# Patient Record
Sex: Female | Born: 1935 | Race: White | Hispanic: No | Smoking: Never smoker
Health system: Southern US, Community
[De-identification: ages and names within clinical notes are randomized; demographics above are authoritative.]

## PROBLEM LIST (undated history)

## (undated) DIAGNOSIS — E039 Hypothyroidism, unspecified: Secondary | ICD-10-CM

## (undated) DIAGNOSIS — I10 Essential (primary) hypertension: Secondary | ICD-10-CM

## (undated) DIAGNOSIS — I82409 Acute embolism and thrombosis of unspecified deep veins of unspecified lower extremity: Secondary | ICD-10-CM

## (undated) DIAGNOSIS — E079 Disorder of thyroid, unspecified: Secondary | ICD-10-CM

## (undated) HISTORY — PX: BRAIN SURGERY: SHX531

---

## 1997-10-23 ENCOUNTER — Ambulatory Visit (HOSPITAL_COMMUNITY): Admission: RE | Admit: 1997-10-23 | Discharge: 1997-10-23 | Payer: Self-pay | Admitting: Obstetrics & Gynecology

## 2003-06-28 ENCOUNTER — Inpatient Hospital Stay (HOSPITAL_COMMUNITY): Admission: EM | Admit: 2003-06-28 | Discharge: 2003-06-30 | Payer: Self-pay | Admitting: Orthopedic Surgery

## 2004-08-15 ENCOUNTER — Ambulatory Visit (HOSPITAL_COMMUNITY): Admission: RE | Admit: 2004-08-15 | Discharge: 2004-08-15 | Payer: Self-pay | Admitting: Ophthalmology

## 2005-02-12 ENCOUNTER — Encounter: Payer: Self-pay | Admitting: Emergency Medicine

## 2005-02-12 ENCOUNTER — Ambulatory Visit: Payer: Self-pay | Admitting: Internal Medicine

## 2005-02-12 ENCOUNTER — Inpatient Hospital Stay (HOSPITAL_COMMUNITY): Admission: AD | Admit: 2005-02-12 | Discharge: 2005-02-16 | Payer: Self-pay

## 2005-02-15 ENCOUNTER — Ambulatory Visit: Payer: Self-pay | Admitting: Cardiology

## 2005-02-16 ENCOUNTER — Encounter: Payer: Self-pay | Admitting: Cardiology

## 2005-05-22 ENCOUNTER — Inpatient Hospital Stay (HOSPITAL_COMMUNITY): Admission: EM | Admit: 2005-05-22 | Discharge: 2005-05-28 | Payer: Self-pay | Admitting: Emergency Medicine

## 2005-06-08 ENCOUNTER — Encounter: Admission: RE | Admit: 2005-06-08 | Discharge: 2005-06-08 | Payer: Self-pay | Admitting: Neurosurgery

## 2005-07-27 ENCOUNTER — Inpatient Hospital Stay (HOSPITAL_COMMUNITY): Admission: AD | Admit: 2005-07-27 | Discharge: 2005-08-02 | Payer: Self-pay | Admitting: Internal Medicine

## 2005-11-26 ENCOUNTER — Encounter (INDEPENDENT_AMBULATORY_CARE_PROVIDER_SITE_OTHER): Payer: Self-pay | Admitting: *Deleted

## 2005-11-26 ENCOUNTER — Ambulatory Visit (HOSPITAL_COMMUNITY): Admission: RE | Admit: 2005-11-26 | Discharge: 2005-11-26 | Payer: Self-pay | Admitting: Family Medicine

## 2008-02-02 ENCOUNTER — Encounter: Admission: RE | Admit: 2008-02-02 | Discharge: 2008-02-21 | Payer: Self-pay | Admitting: Family Medicine

## 2010-05-13 ENCOUNTER — Encounter
Admission: RE | Admit: 2010-05-13 | Discharge: 2010-06-05 | Payer: Self-pay | Source: Home / Self Care | Attending: Family Medicine | Admitting: Family Medicine

## 2010-11-14 NOTE — Discharge Summary (Signed)
[name], [name]            ACCOUNT NO.:  [id number]   MEDICAL RECORD NO.:  [id number]          PATIENT TYPE:  INP   LOCATION:  3106                         FACILITY:  [facility]   PHYSICIAN:  Danae Orleans. Venetia Maxon, M.D.  DATE OF BIRTH:  December 24, 1917   DATE OF ADMISSION:  05/22/2005  DATE OF DISCHARGE:  05/28/2005                                 DISCHARGE SUMMARY   REASON FOR ADMISSION:  Subdural hemorrhage.   ADDITIONAL DIAGNOSES:  1.  Hypothyroidism.  2.  Hypertension.  3.  Unspecified hemiplegia and hemiparesis.  4.  Diplopia.  5.  Speech disturbance.   FINAL DIAGNOSIS:  1.  Subdural hemorrhage.  2.  Hypothyroidism.  3.  Hypertension.  4.  Unspecified hemiplegia and hemiparesis.  5.  Diplopia.  6.  Speech disturbance.   HISTORY OF ILLNESS AND HOSPITAL COURSE:  [name] is an 77 year old  woman who I admitted to the hospital from the emergency room at [facility] on May 22, 2005.  She had hit her head in mid August,  developed dizziness and slurred speech, also with blurred vision, came to  the [facility] Emergency Room after she was seen in Urgent Care and  had a head CT which demonstrated a chronic left subdural hematoma with  effacement of sulci and gyri with minimal left to right shift.  The patient  was having trouble with her balance, falling, dizziness, and slurring of her  speech.  She was admitted to the hospital and was taken to surgery on  May 23, 2005, for bur hole drainage of a left chronic subdural  hematoma.  Postoperatively, she did well.  Her JP drain was continued and  was discontinued as of November 29.  She was observed in the step down unit  and then in the ACU.  On November 30, she was doing well and was discharged  home, and was instructed to follow up in one week for suture removal.  She  was discharged home on Dilantin for seizure prophylaxis and Vicodin 5/500, 1  every 4 hours as needed for pain.      Danae Orleans.  Venetia Maxon, M.D.  Electronically Signed     JDS/MEDQ  D:  07/24/2005  T:  07/25/2005  Job:  [id number]

## 2010-11-14 NOTE — Consult Note (Signed)
NAMEGULIANNA, HORNSBY NO.:  [id number]   MEDICAL RECORD NO.:  [id number]          PATIENT TYPE:  EMS   LOCATION:  ED                           FACILITY:  [facility]   PHYSICIAN:  Etter Sjogren, M.D.     DATE OF BIRTH:  1917-07-05   DATE OF CONSULTATION:  02/12/2005  DATE OF DISCHARGE:                                   CONSULTATION   CHIEF COMPLAINT:  Orbital floor blowout fracture.   HISTORY OF PRESENT ILLNESS:  An 77 year old woman fell from level ground and  fell on the left side of her face.  She had 3 minutes of loss of  consciousness.  She was transferred to [facility] for further  evaluation, where she was found to have a left orbital floor blowout  fracture.  She complains of mild headache.   PAST MEDICAL HISTORY:  Positive for some hypothyroidism, treated with  Synthroid.  Positive for hypercholesterolemia.  Otherwise negative.   ALLERGIES:  None known.   SOCIAL HISTORY:  She denies tobacco or alcohol.   PHYSICAL EXAMINATION:  HEAD AND NECK:  Limited to the head and neck.  PERRL.  EOMI.  There is no double vision, primary or secondary gaze.  There is no  evidence of any eye entrapment.  No nasal tenderness.  No tenderness of the  TMJ or the zygoma.   IMPRESSION:  Left orbital floor blowout fracture that I doubt will require  any operative intervention.  She also has a nondisplaced fracture of the  nasal bone on the left side.   RECOMMENDATIONS:  Keflex, because the fracture does extend into the left  maxillary sinus.  Ice to eye pads over the next couple of days.  Continue  observation.      Etter Sjogren, M.D.  Electronically Signed     DB/MEDQ  D:  02/12/2005  T:  02/12/2005  Job:  [id number]   cc:   Sheppard Penton. Stacie Acres, M.D.  Fax: [phone]

## 2010-11-14 NOTE — Consult Note (Signed)
NAMESILVIE, OBREMSKI NO.:  [id number]   MEDICAL RECORD NO.:  [id number]          PATIENT TYPE:  INP   LOCATION:  3032                         FACILITY:  [facility]   PHYSICIAN:  Rollene Rotunda, M.D.   DATE OF BIRTH:  [birth year]   DATE OF CONSULTATION:  02/15/2005  DATE OF DISCHARGE:                                   CONSULTATION   REFERRING PHYSICIAN:  Ileana Roup, M.D.   PRIMARY CARE PHYSICIAN:  Carolynn Serve, M.D.   REASON FOR CONSULTATION:  Syncope.   HISTORY OF PRESENT ILLNESS:  The patient is a lovely 77 year old white  female with no prior cardiac history.  She has been very active.  She does  gardening and climbs her 17 stairs in the house frequency.  With this she  denies any outpatient chest discomfort, neck discomfort, arm discomfort,  activity-induced nausea and vomiting, plus diaphoresis.  She has never  noticed any palpitations and has had no presyncope.  She has never had any  orthostatic symptoms or dizziness.  She was doing her usual activities  helping folks at [facility] do their shopping.  She stepped out of the  bus.  She said she was standing still. She had not turned her head.  She  felt fine.  All of a sudden, she said it looked like things went white and  she said she was going to lose consciousness.  She lost consciousness but  does not recall any of this.  She hit the ground suffering trauma with a  blow-out left orbital fracture, small subdural hematoma and injury to her  right hand.  She was told she was out for three minutes.  There was no  loss of bowel or bladder.  There was no seizure activity.  She knew where  she was when she came to.  She has been fine since then.   PAST MEDICAL HISTORY:  1.  Hypertension, mild.  2.  Hyperlipidemia.  3.  Hypothyroidism.   PAST SURGICAL HISTORY:  1.  Cataract surgery and repair of retinal detachment.  2.  Repair of left wrist fracture secondary to fall.   ALLERGIES:  None.   MEDICATIONS:  1.  Synthroid 100 mcg daily.  2.  Omnicef.  3.  Vasotec 5 mg daily.  4.  Lasix 20 mg daily.   SOCIAL HISTORY:  The patient lives in [place] alone.  She has no  children.  She is a widow.  She does not smoke cigarettes or drink alcohol.   FAMILY HISTORY:  Noncontributory for early coronary artery disease.   REVIEW OF SYSTEMS:  As stated in the HPI.  Positive for joint pains in both  her hands.  Otherwise negative for all other review of systems.   PHYSICAL EXAMINATION:  GENERAL:  The patient is in no distress.  VITAL SIGNS:  Blood pressure 135/71 without orthostatic changes, heart rate  66 and regular, afebrile.  HEENT:  Right eyelid unremarkable.  Left eyelid ecchymotic and matted shut  with sutures in the eyebrow above.  Right  pupil is round and reactive.  Fundi not visualized.  Left  eye not visualized.  Oral mucosa unremarkable.  NECK:  No jugular venous distension.  Wave form within normal limits.  Carotid upstroke brisk and symmetric, no bruits.  No thyromegaly.  LYMPHATICS:  No cervical, axillary, inguinal adenopathy.  LUNGS:  Clear to auscultation bilaterally.  BACK:  No costovertebral angle tenderness.  CHEST:  Unremarkable.  HEART:  PMI not displaced or sustained.  S1 and S2 within normal limits.  No  S3.  No S4.  No clicks, no rubs, no murmurs.  ABDOMEN:  Flat, positive bowel sounds.  Normal in frequency and pitch.  No  bruits.  No rebound, no guarding in the midline, __________ .  No  hepatosplenomegaly.  SKIN:  No rashes, no nodules.  EXTREMITIES:  2+ pulses throughout.  Trauma on the right wrist __________  appreciated.  No cyanosis, no clubbing, no edema.  NEUROLOGIC:  Oriented to person, place and time.  Cranial nerves are grossly  intact (optic not assessed).  Motor grossly intact.   LABORATORY DATA:  Electrocardiogram:  Sinus rhythm with premature atrial  contractions, axis within normal limits, intervals within normal limits.  No  acute ST-T  wave changes.   ASSESSMENT/PLAN:  Syncope.  The patient had a frank syncopal episode with  severe trauma.  There is no orthostatic symptoms.  It does not seem to have  a story consistent with a vasovagal episode.  She has no prior cardiac  history and a very unremarkable exam without evidence of cardiovascular  conduction disease.  However, bradyarrhythmia needs to be considered along  with other cardiac causes.  I have instructed the patient she cannot drive  for at least six months.  She is on telemetry now.  We will get an  echocardiogram.  She needs a stress perfusion study at some point.  I would  like to get carotid Dopplers (low pretest probability of bilateral  obstructive disease).  Will do carotid massage after we have seen these.  If  the above is negative, she may need a loop recorder.           ______________________________  Rollene Rotunda, M.D.     JH/MEDQ  D:  02/15/2005  T:  02/16/2005  Job:  [id number]   cc:   Carolynn Serve  [address]  [place]  [place] [address]  Fax: [phone]

## 2010-11-14 NOTE — H&P (Signed)
NAMESHARIYAH, ELAND            ACCOUNT NO.:  [id number]   MEDICAL RECORD NO.:  [id number]          PATIENT TYPE:  INP   LOCATION:  1831                         FACILITY:  [facility]   PHYSICIAN:  Danae Orleans. Venetia Maxon, M.D.  DATE OF BIRTH:  1918/04/11   DATE OF ADMISSION:  05/22/2005  DATE OF DISCHARGE:                                HISTORY & PHYSICAL   REASON FOR ADMISSION:  Slurred speech, dizziness, and left subdural  hematoma.   HISTORY OF ILLNESS:  [name] is an 77 year old woman who fell and  hit her head in mid August and has developed dizziness and slurred speech,  also with blurred vision.  She comes to [facility] emergency room  today after initially being seen at an urgent care and a head CT was  obtained which demonstrated chronic left subdural hematoma with effacement  of the sulci and gyri with minimal left to right shift.  The patient notes  trouble with her balance, falling, and dizziness, and slurring of her  speech.   PAST MEDICAL HISTORY:  1.  Hypothyroidism.  2.  Questionable hypertension.   MEDICATIONS:  Synthroid.   She has no known drug allergies.   PHYSICAL EXAMINATION:  VITAL SIGNS:  Temperature is 97.7, blood pressure of  157/80, pulse of 95, respiratory rate of 20.  GENERAL:  She is awake, alert, and fully oriented.  She is a pleasant  elderly woman.  NEUROLOGIC:  She has mild dysarthria.  She has right pronator drift.  She  has mild right hemiparesis with hand intrinsic and finger extensor weakness.  She notes numbness in both lower extremities which she says is of  longstanding.  Reflexes are 2 in the biceps, triceps, and brachial radialis,  2 at the knees, 2 at the ankles.  Great toes downgoing to plantar  stimulation.  CHEST:  Clear to auscultation.  HEART:  Regular rate and rhythm without murmur.  ABDOMEN:  Soft, nontender.  Active bowel sounds.  No hepatosplenomegaly  appreciated.  EXTREMITIES:  Without edema, clubbing, or  cyanosis.   IMPRESSION:  [name] is an 77 year old woman with a chronic  subdural hematoma.  She is being admitted to the neuro ACU for bur hole  drainage of subdural hematoma in the morning.  She will be loaded with  Dilantin prior to surgery.      Danae Orleans. Venetia Maxon, M.D.  Electronically Signed    JDS/MEDQ  D:  05/22/2005  T:  05/22/2005  Job:  [id number]

## 2010-11-14 NOTE — H&P (Signed)
[name], [name]            ACCOUNT NO.:  [id number]   MEDICAL RECORD NO.:  [id number]          PATIENT TYPE:  INP   LOCATION:  NA                           FACILITY:  [facility]   PHYSICIAN:  Adolph Pollack, M.D.DATE OF BIRTH:  June 20, 1918   DATE OF ADMISSION:  02/12/2005  DATE OF DISCHARGE:                                HISTORY & PHYSICAL   HISTORY OF PRESENT ILLNESS:  This 77 year old female fell from level ground  with approximately three minutes of loss of consciousness.  She did note she  was falling and then does not remember anything after that.  She was  transported to [facility] Emergency Department for evaluation complaining  of a headache.  Evaluation there demonstrated a small left subdural hematoma  and left orbital blow-out fracture.  For this reason, the trauma surgeon was  asked to see her.  She is currently awake, alert, and complaining of a mild  headache.   PAST MEDICAL HISTORY:  1.  Hypothyroidism.  2.  Hypercholesterolemia.  3.  Hypertension.   PAST SURGICAL HISTORY:  Cataract surgery.   MEDICATIONS:  Synthroid.   ALLERGIES:  None reported.   SOCIAL HISTORY:  No tobacco or alcohol use.   REVIEW OF SYSTEMS:  CARDIAC:  She denies any heart disease.  NEUROLOGIC:  She denies strokes or seizures.  PULMONARY:  Denies asthma, pneumonia, COPD.  GASTROINTESTINAL:  Denies peptic ulcer disease, hepatitis, diverticulitis.  GENITOURINARY:  Denies any kidney stones.   PHYSICAL EXAMINATION:  GENERAL:  An elderly female in no acute distress.  VITAL SIGNS:  Temperature 97.1, blood pressure 201/80, pulse 78.  HEENT:  Normocephalic;  there is left periorbital ecchymoses;  PERRLA, EOMI;  tympanic membranes negative;  a small abrasion and small laceration held  together by Steri-Strips in the left supraorbital area.  NECK:  Nontender.  Trachea is midline.  No crepitus.  CARDIOVASCULAR:  Regular rate and rhythm.  CHEST:  No contusions, no tenderness, equal breath  sounds, clear to  auscultation.  ABDOMEN:  Soft, nontender, no contusions.  PELVIS:  Nontender and stable.  BACK:  No tenderness or deformity.  EXTREMITIES:  There is a left elbow ecchymoses and mild tenderness;  there  is right thumb swelling, ecchymoses, and tenderness.  NEUROLOGIC:  She is alert and oriented x3, and has adequate motor strength  except around the right thumb.  Glasgow coma scale is 15.   LABORATORY DATA:  Electrolytes within normal limits except for a glucose of  124.  Hemoglobin 13.1, platelet count 267,000.   Head CT scan demonstrates a left subdural hematoma.  Questionable parietal  hematoma as well.  CT scan of the C-spine shows no acute fracture.  Facial  CT scan demonstrates a left orbital blow-out fracture.  Left elbow x-ray  negative for fracture.  Right thumb x-ray shows fracture of the proximal and  distal phalanx, the phalanges, with some intra-articular involvement.   IMPRESSION:  1.  Left subdural hematoma after fall.  Currently, neurologically intact.  2.  Left orbital blow-out fracture.  3.  Hypertension.  4.  Right thumb fracture.  5.  Incomplete  workup.   PLAN:  We will transfer to the [facility] and the intensive  care unit.  We will consult neurosurgery, maxillofacial surgery, and  orthopaedic surgery.  We will repeat head CT scan tomorrow.  We need to get  a portable chest x-ray upon arrival to the intensive care unit.      Adolph Pollack, M.D.  Electronically Signed     TJR/MEDQ  D:  02/12/2005  T:  02/12/2005  Job:  [id number]

## 2010-11-14 NOTE — Op Note (Signed)
NAME:  [name], [name]                      ACCOUNT NO.:  [id number]   MEDICAL RECORD NO.:  [id number]                   PATIENT TYPE:  INP   LOCATION:  0001                                 FACILITY:  [facility]   PHYSICIAN:  Deidre Ala, M.D.                 DATE OF BIRTH:  05-04-1918   DATE OF PROCEDURE:  06/28/2003  DATE OF DISCHARGE:                                 OPERATIVE REPORT   PREOPERATIVE DIAGNOSES:  Closed comminuted left distal radius Colles  fracture angulated nonarticular.   POSTOPERATIVE DIAGNOSES:  Closed comminuted left distal radius Colles  fracture angulated nonarticular.   PROCEDURE:  1. Open reduction and internal fixation of left distal radius Colles     fracture using DVR plate.  2. Intraoperative fluoroscopy.   SURGEON:  Bradley Ferris, M.D.   ASSISTANT:  Madilyn Fireman, P.A.-C.   ANESTHESIA:  General endotracheal.   CULTURES:  None.   DRAINS:  None.   ESTIMATED BLOOD LOSS:  Less than 50 mL, replaced without.   TOURNIQUET TIME:  55 minutes.   HISTORY:  Martin is a very healthy 77 year old who fell today sustaining  this fracture.  Anatomic reduction was obtained using a double row DVR plate  with six locking screws distally through a volar approach.  C-arm  fluoroscopy confirmed positioning.   DESCRIPTION OF PROCEDURE:  With adequate anesthesia obtained using  endotracheal technique, 1 g of Ancef given IV prophylaxis, the patient was  placed in supine position and the left hand was prepped from fingertips to  the tourniquet in standard fashion. After standard prepping and draping,  esmarch exsanguination was used. The tourniquet was let up to 250 mmHg.  A  standard zigzag distal incision was made then along the flexor carpi  radialis. The incision was deep and sharp with a knife and hemostasis  obtained using a Associate Professor.  The tendon sheath of the FCR was  then incised longitudinally and retracted.  I then removed the pronator as  a  flap. This exposed the fracture site which was gently manipulated and  reduced. We then placed the DVR plate on with four holes proximal at the  appropriate length and placed the first screw in the slotted hole and  adjusted our length distally.  I then raised the fracture holding it in some  flexion and ulnar deviation and placed the central two holes proximal and  then the radial styloid, ulnar styloid and distal two screws locking fully  and partially threaded with C-arm fluoroscopy confirming positioning. When I  was satisfied with the reduction, irrigation was carried out, the wound was  closed in layers with running 2-0 Vicryl on the flexor sheath of the FCR.  A  2-0 Vicryl subcu and interrupted 3-  0 nylon vertical mattress sutures. A bulky sterile compressive dressing was  applied with volar plaster splint and hand-type dressing. The patient having  tolerated the procedure well  was awakened and taken to the recovery room in  satisfactory condition to be admitted for elevation and overnight  observation and analgesia.                                               Deidre Ala, M.D.    VEP/MEDQ  D:  06/28/2003  T:  06/28/2003  Job:  [id number]   cc:   Tracey Harries, M.D.  [address]  [place]  [place] [address]  Fax: [phone]

## 2010-11-14 NOTE — Op Note (Signed)
[name], [name]            ACCOUNT NO.:  [id number]   MEDICAL RECORD NO.:  [id number]          PATIENT TYPE:  INP   LOCATION:  3113                         FACILITY:  [facility]   PHYSICIAN:  Danae Orleans. Venetia Maxon, M.D.  DATE OF BIRTH:  June 05, 1918   DATE OF PROCEDURE:  05/23/2005  DATE OF DISCHARGE:                                 OPERATIVE REPORT   PREOPERATIVE DIAGNOSIS:  Chronic left subdural hematoma.   POSTOPERATIVE DIAGNOSIS:  Chronic left subdural hematoma.   PROCEDURE:  Burhole drainage, left subdural hematoma.   SURGEON:  Danae Orleans. Venetia Maxon, M.D.   ANESTHESIA:  General endotracheal anesthesia.   ESTIMATED BLOOD LOSS:  Minimal.   COMPLICATIONS:  None.   DISPOSITION:  Recovery.   INDICATIONS FOR PROCEDURE:  [name] is an 77 year old woman with  dizziness, slurred speech, and right pronator drift.  She has a high  convexity left chronic subdural hematoma who was taken to surgery for bur  hole drainage of subdural.   DESCRIPTION OF PROCEDURE:  [name] was brought to the operating room.  Following the satisfactory and uncomplicated induction of general  endotracheal anesthesia and placement of intravenous antibiotics, the  patient was placed in the supine position on the operating table.  A bump  was placed under her left side of her body and the right side of her head  was placed on a donut head holder.  Her left parietal scalp was shaved and  then prepped and draped in the usual sterile fashion after infiltrating the  skin and subgaleal tissues with 0.25% Marcaine, 0.5% lidocaine, and  1:200,000 epinephrine.  Approximately a one inch incision was made and  carried to the skull which was then opened with an acorn bit and a small  trephine.  The dura was then incised and there was a subdural membrane which  was then incised.  There appeared to be multiple membranes and these were  also incised by grasping with an A[employer]on pickup and teasing them open.  The  result  was an egress of motor oil consistency subdural blood.  The subdural  space was then irrigated and subsequently through separate stab incision, a  #7 JP drain was inserted and anchored in place with a nylon stitch.  The  drain was inserted into the subdural space.  The wound was  then closed with interrupted Vicryl and running nylon stitch, and a sterile,  occlusive dressing was placed.  The patient was extubated in the operating  room and taken to the recovery room in stable, satisfactory condition having  tolerated the operation.  All counts were correct at the end of the case.      Danae Orleans. Venetia Maxon, M.D.  Electronically Signed     JDS/MEDQ  D:  05/23/2005  T:  05/23/2005  Job:  [id number]

## 2010-11-14 NOTE — Discharge Summary (Signed)
NAMEHENRINE, [name]            ACCOUNT NO.:  [id number]   MEDICAL RECORD NO.:  [id number]          PATIENT TYPE:  INP   LOCATION:  3733                         FACILITY:  [facility]   PHYSICIAN:  Kela Millin, M.D.DATE OF BIRTH:  [birth year]   DATE OF ADMISSION:  07/27/2005  DATE OF DISCHARGE:  08/02/2005                                 DISCHARGE SUMMARY   DISCHARGE DIAGNOSES:  1.  Left lower extremity deep vein thrombosis.  2.  Hypothyroidism.  3.  History of head injury in August, 2006, with subdural hematoma, status      post bur hole procedure.  4.  History of urinary incontinence, on Detrol.  5.  History of wrist fracture, status post surgery/pinning.   STUDIES:  Lower extremity Doppler ultrasound, acute DVT involving the left  superficial femoral vein from the groins into the distal popliteal and also  one of the paired PTVs involved to the midcalf.   HISTORY:  The patient is an 77 year old white female who had been discharged  from the hospital on May 28, 2005, following a bur hole procedure for  chronic dural hematoma and she presented with complaints of left lower  extremity swelling.  She stated that since her discharge from the hospital  she had some initial difficulty with diplopia and dizziness, but that had  improved.  She had no recurrence of falling.  About a month prior to this  presentation, she began noticing a significant amount of swelling in the  left ankle and distal calf.  She denied redness, pain, fevers, no impairment  of her ambulation.  She finally went to see Dr. Corliss Blacker and was sent for a  venous Doppler ultrasound on Monday, July 27, 2005.  It showed a DVT  involving the popliteal vein on the left to the mid calf distally as well as  the left superficial femoral vein from the origin to the distal popliteal.  She was, thus, admitted to the [facility] for further  evaluation and management.  She denies coughing, shortness of  breath and  chest pain.   PHYSICAL EXAM UPON ADMISSION:  Is as per Dr. Landry Dyke H&P, the pertinent  findings on the exam were 2+ edema of the left ankle and distal calf, no  erythema, nontender, Homan's sign negative.   LABORATORY DATA:  Lower extremity Doppler ultrasound results as noted above.  Her white cell count was 5.9  glucose 128, and her LFTs were within normal  limits, TSH decreasd at 0.025.   HOSPITAL COURSE:  1.  Left lower extremity deep vein thrombosis.  Upon admission it was noted      from talking to the patient that she would be a reasonable Coumadin      candidate in spite of her having had a past head injury.  She seems to      be at low risk of falling and has a good support system to ensure her      compliance with medications.  She was started on anticoagulation with      Lovenox as per protocol and Coumadin therapy.  She was  kept in the      hospital for the duration of this treatment until her INR became      therapeutic, so that she could be closely monitored given her past      history of the subdural hematoma.  The patient's PT and INR were closely      monitored as she has been on both Lovenox and Coumadin.  Today her INR      is 2.6, she has no signs of bleeding.  Also, she has no neurologic      symptoms, she is alert, lucid/appropriate.  She is to followup with Dr.      Gweneth Dimitri to have her PT, INR rechecked in the morning and her      Coumadin dose to be adjusted as appropriate.  Her Lovenox was      discontinued once her INR became therapeutic.  2.  Hypothyroidism.  As discussed above, the patient had her TSH checked      upon admission and it was noted to be 0.025, the patient had been on      Levothyroxine 100 mcg daily and the dose was decreased to 50 mcg daily.      The patient is to followup with Dr. Corliss Blacker.  Her TSH to be rechecked in      a few weeks and her Synthroid dose adjusted as appropriate.  3.  History of urinary incontinence.   Patient to continue Detrol.   DISCHARGE MEDICATIONS:  1.  Coumadin 2.5 mg one p.o. q.p.m., her PT, INR is to be checked in the      a.m. and the dose of her Coumadin adjusted as appropriate.  2.  Synthroid 50 mcg one p.o. daily.   FOLLOWUP CARE:  Dr. Corliss Blacker in a.m. for PT, INR recheck.   DISCHARGE CONDITION:  Improved/stable.      Kela Millin, M.D.  Electronically Signed     ACV/MEDQ  D:  08/02/2005  T:  08/02/2005  Job:  [id number]   cc:   Pam Drown, M.D.  Fax: [phone]

## 2010-11-14 NOTE — H&P (Signed)
NAMEANYAH, SWALLOW NO.:  [id number]   MEDICAL RECORD NO.:  [id number]          PATIENT TYPE:  INP   LOCATION:  3733                         FACILITY:  [facility]   PHYSICIAN:  Sherin Quarry, MD      DATE OF BIRTH:  11-04-1917   DATE OF ADMISSION:  07/27/2005  DATE OF DISCHARGE:                                HISTORY & PHYSICAL   [name] is a 77 year old lady who was discharged from the hospital  on November 30 after undergoing a bur-hole procedure for a chronic subdural  hematoma.  Since her discharge, she has been doing well.  She had some  initial difficulty with diplopia and dizziness but this seems to have  greatly improved.  There has been no recurrence of falling.  About one month  prior to this presentation, she began to notice that there was significant  swelling of her left ankle and distal calf.  This would gradually worsen as  the day went on.  The right ankle and calf had no swelling.  There was no  associated redness, pain, fever, or impairment of ambulation.  Eventually,  at the insistence of her friends, she was evaluated by Dr. Gweneth Dimitri for  this problem and was sent for a venous Doppler study.  The Doppler study was  done on Monday, January 29.  This showed an acute DVT involving the  popliteal vein on the left to the mid calf distally as well as the left  superficial femoral vein from the origin to the distal popliteal.  In light  of this finding, the patient was referred to [facility] for  admission for anticoagulation.  She is otherwise feeling well and denies  coughing, shortness of breath, or chest pain   PAST MEDICAL HISTORY:   CURRENT MEDICATIONS:  Synthroid one tablet daily dose, uncertain and Detrol  one tablet daily.  The dose of this medication is also unknown.  The patient  was taking prophylactic Dilantin after her neurosurgical procedure but this  was discontinued approximately one month ago.  She also was not  taking any  pain medication.  There are no known allergies.   OPERATIONS:  1.  In 2004 she had pins placed in her wrist.  2.  In 2006 she underwent burr hole placement for a left chronic subdural      hematoma.  This subdural hematoma was thought to have arisen as result      of a fall and a blow to the head which occurred in August.  3.  She also recalls having fractured her kneecap in the past, but does not      think she had an operation to repair this problem.   MEDICAL ILLNESSES:  1.  Hypothyroidism.  2.  Urinary incontinence.   FAMILY HISTORY:  Noncontributory given the patient's advanced age.   SOCIAL HISTORY:  She resides at home with a nurse's aide who assists with  her day-to-day care.  She also has an extensive network of friends which are  helpful to her.  She does not smoke.  She denies abuse of  alcohol.   REVIEW OF SYSTEMS:  HEENT:  Head:  She denies headache or dizziness.  Eyes:  She still has mild residual diplopia.  Ear, nose, and throat: Denies  earache, sinus pain, or sore throat.  CHEST:  Denies coughing, wheezing, or  chest congestion.  CARDIOVASCULAR:  Denies orthopnea or PND.  GI:  Denies  nausea, vomiting, or abdominal pain.  GU:  She has mild urinary incontinence  much better on Detrol.  NEURO:  Has no history of seizure or stroke.  ENDO:  Denies excessive thirst, urinary frequency, or nocturia.   PHYSICAL EXAMINATION:  HEENT:  Within normal limits.  CHEST:  Clear to auscultation.  BACK:  No CVA or point tenderness.  CARDIOVASCULAR:  Normal S1, S2.  There are no rubs, murmurs, or gallops.  ABDOMEN:  Benign There are normal bowel sounds without masses or tenderness.  No guarding or rebound.  NEUROLOGIC:  Cranial nerves, motor, sensory, cerebellar testing is normal.  Station and gait is normal.  EXTREMITIES:  2+ edema of the left ankle and distal calf.  These areas are  not reddened.  They are not tender or painful.  There are no palpable cords.  Denna Haggard'  sign is negative.  The proximal thigh appears to be normal in  appearance.   IMPRESSION:  1.  Acute deep venous thrombosis of the left calf/proximal thigh.  2.  History of head injury August 2006 with chronic subdural status post      burr hole drainage November of 2006.  3.  Hypothyroidism.  4.  Urinary incontinence, Detrol therapy.  5.  History of diplopia and speech disturbance secondary to #2, resolving.  6.  History of wrist fracture status post pinning.   PLAN:  It would appear from talking to this patient and from what I know of  her history that she would be a reasonable Coumadin candidate in spite of  having had the past head injury.  She seems to be at low risk for falling  and she has a support system to ensure her compliance with medications.  We  will start her on Lovenox per pharmacy protocol and Coumadin therapy.  We  will follow her PT and INR carefully.  She will plan to follow up with Dr.  Gweneth Dimitri by in the office for subsequent prothrombin time  determinations.  Otherwise, she will continue her usual medications.           ______________________________  Sherin Quarry, MD     SY/MEDQ  D:  07/27/2005  T:  07/27/2005  Job:  [id number]   cc:   Pam Drown, M.D.  Fax: [phone]

## 2010-11-14 NOTE — Discharge Summary (Signed)
[name], PEGGS            ACCOUNT NO.:  [id number]   MEDICAL RECORD NO.:  [id number]          PATIENT TYPE:  INP   LOCATION:  3032                         FACILITY:  [facility]   PHYSICIAN:  Gabrielle Dare. Janee Morn, M.D.DATE OF BIRTH:  06-16-1918   DATE OF ADMISSION:  02/12/2005  DATE OF DISCHARGE:  02/16/2005                                 DISCHARGE SUMMARY   DISCHARGE DIAGNOSES:  1.  Fall from level ground.  2.  Mild traumatic brain injury with small left subdural hematoma.  3.  Left orbital blowout fracture.  4.  Probable syncope.  5.  Hypothyroidism.  6.  Nondisplaced nasal fracture.  7.  Right thumb proximal and distal phalanx fractures.   CONSULTANTS:  Dr. Hilda Lias, neurosurgery; Dr. Annell Greening, orthopedic  surgery; Dr. Etter Sjogren, plastic surgery; teaching service B; [facility]  cardiology consultation.   HISTORY:  This is an 77 year old female who fell from level ground with  approximately three minutes loss of consciousness. She knew that she was  falling but did not recall anything after that. She was brought to [facility] Emergency Department and found to have a small left subdural  hematoma and left orbital blowout fracture and for this reason she was asked  to be admitted by the trauma service to [facility]. She was also  found to have a right proximal and distal thumb phalanx fracture. CT scan of  the C-spine was negative for fractures. Facial CT scan again showed left  orbital blowout fracture.   The patient was admitted to the neuro intensive care unit for observation.  She had a follow-up CT scan the day following admission with improvement in  her subdural hematoma.   She was seen in consultation per Dr. Ophelia Charter and placed in a thumb spica  splint for her fractures and was to follow up with Dr. Ophelia Charter after  discharge. She was seen in consultation by Dr. Odis Luster for her left orbital  blowout fracture and it was recommended that she follow  up ain approximately  two months or sooner should she develop any deformity or difficulties with  regards to this. She was seen by the medicine teaching service due to  concerns over syncope and subsequently underwent a cardiology workup by  [facility] Cardiology. Her TSH was found to be low at 0.020 and it was felt  that her Synthroid should be decreased and this was done. She underwent  carotid Dopplers and 2-D echocardiogram the evening of her discharge. The 2-  D echocardiogram she overall normal LV function with estimated ejection  fraction of 55-65%. No regional wall motion abnormalities. She had some mild  aortic valvular calcification and mild fibrocalcific changes of the aortic  root. Carotid Dopplers showed minimal to mild heterogenous plaque in the  proximal ICA but no significant stenosis. Vertebral artery flow was  antegrade bilaterally.   The patient was discharged home in stable, improved condition on Synthroid  100 mcg p.o. q.d., Omnicef 300 milligrams p.o. b.i.d. until all taken,  Vasotec 5 milligrams p.o. q.d. and Vicodin as needed for pain. She was to  follow  up with Dr. Jeral Fruit, neurosurgery, as needed with Dr. Odis Luster, plastic  surgery, in two months, with Dr. Antoine Poche the [facility] Cardiology. They will  call to arrange follow-up appointments for her and with Trauma Service in 1-  2 weeks.      Shawn Rayburn, P.A.      Gabrielle Dare Janee Morn, M.D.  Electronically Signed    SR/MEDQ  D:  07/09/2005  T:  07/10/2005  Job:  [id number]

## 2010-11-14 NOTE — Consult Note (Signed)
[name], [name] NO.:  [id number]   MEDICAL RECORD NO.:  [id number]          PATIENT TYPE:  INP   LOCATION:  3108                         FACILITY:  [facility]   PHYSICIAN:  Hilda Lias, M.D.   DATE OF BIRTH:  1918/03/25   DATE OF CONSULTATION:  02/13/2005  DATE OF DISCHARGE:                                   CONSULTATION   HISTORY OF PRESENT ILLNESS:  The lady is an 77 year old who fell, and she  was taken to [facility].  The patient was supposed to be  transferred in the afternoon to [facility], and since the family  was not available, already, I went to the emergency room at [facility] to see her.  When I saw her, she already had a patch on the left  eye.  She was awake, oriented, and the other complaint was some mild  headache.  Clinically, she was stable.  She has full ocular movement.  She  has no weakness whatsoever, oriented x3, sensation was completely normal.  CT scan showed a small amount of extradural blood in the left sylvian area.  There was no evidence of any major surgical compromise.  The brain was with  no ___________ whatsoever.  [name] eventually is going to be  transferred to [facility].  We are going to repeat the CT scan in  the next 24 hours, just to be sure that the blood is not increasing.  Looking ahead clinically, it is seen she had plenty of space in the brain,  and the possibility of surgery will be minimal.  Nevertheless, we just want  to be sure, and that is the main reason she is going to be transferred to  the neurosurgical intensive care unit.           ______________________________  Hilda Lias, M.D.     EB/MEDQ  D:  02/13/2005  T:  02/13/2005  Job:  [id number]

## 2010-11-14 NOTE — H&P (Signed)
[name], KRIZEK            ACCOUNT NO.:  [id number]   MEDICAL RECORD NO.:  [id number]          PATIENT TYPE:  OIB   LOCATION:  NA                           FACILITY:  [facility]   PHYSICIAN:  Guadelupe Sabin, M.D.DATE OF BIRTH:  11-02-1917   DATE OF ADMISSION:  08/15/2004  DATE OF DISCHARGE:                                HISTORY & PHYSICAL   REASON FOR ADMISSION:  This was a planned outpatient admission of this 77-  year-old white female admitted for cataract implant surgery of the left eye.   PRESENT ILLNESS:  This patient has a long history of ocular problems  including retinal detachment surgery August 17, 1970, in right eye,  June 05, 1972, retinal detachment surgery left eye, August 24, 1975,  removal of silicone implant removal, right eye, and January 23, 1978,  cataract  implant surgery right eye.  Despite all these multiple procedures, the  patient has done well with vision of approximately 20/40 in the right eye,  20/30 in the left eye.  Recently, however, the patient has noted increasing  difficulty seeing with the left eye and deterioration of vision to 20/60  minus.  Increasing myopia has developed with cataract formation and the  patient has elected to proceed with cataract implant surgery at this time.  She signed an informed consent and arrangements were made for her outpatient  admission at this time.   PAST MEDICAL HISTORY:  The patient is under the care of Dr. Stevphen Rochester and is  felt to be in satisfactory condition for her age.   CURRENT MEDICATIONS:  1.  Synthroid for hypothyroidism.  2.  Aspirin.  3.  Vitamins.   ALLERGIES:  No known drug allergies.   REVIEW OF SYSTEMS:  No cardiorespiratory complaints.   PHYSICAL EXAMINATION:  VITAL SIGNS:  As recorded on admission blood pressure  137/79, pulse 75, respirations 20, temperature 97.  GENERAL APPEARANCE:  The patient is a pleasant well-nourished, well-  developed 77 year old, white female in no acute  distress. HEENT:  EYES:  Visual acuity with best correction 20/30 right eye, 20/60 minus left eye.  Applanation tonometry 19 mm, right eye, 21 left eye.  Slit lamp examination  with the eyes are white and clear with old conjunctival scarring from her  multiple surgical procedures.  Cornea is clear.  Anterior chamber deep and  clear.  The right eye shows a clear, well-centered posterior chamber  intraocular lens implant.  The left eye shows nuclear cataract formation.  Dilated detailed fundus examination shows a clear vitreous, attached retina  with old scleral buckling surgery.  The optic nerve blood vessels and macula  appear normal.  CHEST/LUNGS:  Clear to percussion and auscultation.  HEART:  Normal sinus rhythm, no cardiomegaly. No murmurs.  ABDOMEN:  Negative.  EXTREMITIES:  Negative   ADMISSION DIAGNOSIS:  1.  Senile nuclear cataract left eye.  2.  Pseudophakia right eye.  3.  Status post scleral buckling procedure for retinal detachment both eyes.   PLAN:  Cataract implant surgery left eye.      HNJ/MEDQ  D:  08/15/2004  T:  08/15/2004  Job:  [id number]

## 2010-11-14 NOTE — Op Note (Signed)
NAMEMANILLA, [name]            ACCOUNT NO.:  [id number]   MEDICAL RECORD NO.:  [id number]          PATIENT TYPE:  OIB   LOCATION:  NA                           FACILITY:  [facility]   PHYSICIAN:  Guadelupe Sabin, M.D.DATE OF BIRTH:  [birth year]   DATE OF PROCEDURE:  08/15/2004  DATE OF DISCHARGE:                                 OPERATIVE REPORT   PREOPERATIVE DIAGNOSIS:  Senile nuclear cataract, left eye.   POSTOPERATIVE DIAGNOSIS:  Senile nuclear cataract, left eye.   OPERATION:  Planned extracapsular cataract extraction --  phacoemulsification, primary insertion of posterior chamber intraocular lens  implant.   SURGEON:  Guadelupe Sabin, M.D.   ASSISTANT:  Nurse.   ANESTHESIA:  Local 4% Xylocaine, 0.75% Marcaine retrobulbar block with  Wydase added, topical tetracaine, intraocular Xylocaine, anesthesia standby  required, patient given sodium Pentothal intravenously during the period of  retrobulbar injection.   OPERATIVE PROCEDURE:  After the patient was prepped and draped, a lid  speculum was inserted in the left eye.  The eye was turned downward and a  superior rectus traction suture placed.  Schiotz tonometry was recorded at 7  scale units with a 5.5-gram weight.  A peritomy was performed adjacent to  the limbus from the 11 to 1 o'clock position.  The corneoscleral junction  was cleaned and a corneoscleral groove made with a 45-degree Superblade.  The anterior chamber was then entered with a 2.5-mm diamond keratome at the  12 o'clock position and the 15-degree blade at the 2:30 position.  Using a  bent 26-gauge needle on an [place]oat syringe, a circular capsulorrhexis was  begun and then completed with the Grabow forceps.  Hydrodissection and  hydrodelineation were performed using 1% Xylocaine.  The 30-degree  phacoemulsification tip was then inserted with slow controlled  emulsification of the lens nucleus, total ultrasonic time -- 1 minute 14  seconds, average power  level -- 19%,  total amount of fluid used -- 90 mL.  Following removal of the nucleus, the residual cortex was aspirated with the  irrigation-aspiration tip.  The posterior capsule appeared intact with a  brilliant red fundus reflex; it was therefore elected to insert an [employer] [id number] silicone 3-piece posterior chamber intraocular lens  implant, diopter strength +16.00; this was inserted with the McDonald  forceps into the anterior chamber and then centered into the capsular bag  using the [facility] lens rotator; the lens appeared to be well-centered.  The  Provisc and [place] which had been used intermittently during the procedures  were aspirated and replaced with balanced salt solution and Miochol  ophthalmic solution.  The operative incisions appeared to be self-sealing;  it was, however, elected to place a single 10-0 interrupted nylon suture  across the larger 12 o'clock incision to ensure closure and to prevent  endophthalmitis.  Maxitrol ointment was instilled in the conjunctival cul-de-sac and a light  patch protector shield applied.  Duration of procedure and anesthesia  administration -- 45 minutes.  The patient tolerated the procedure well in  general and left the operating room to the recovery room in good condition.  HNJ/MEDQ  D:  08/15/2004  T:  08/15/2004  Job:  [id number]

## 2011-05-25 ENCOUNTER — Encounter: Payer: Self-pay | Admitting: Emergency Medicine

## 2011-05-25 ENCOUNTER — Observation Stay (HOSPITAL_COMMUNITY): Payer: Medicare Other

## 2011-05-25 ENCOUNTER — Emergency Department (HOSPITAL_COMMUNITY): Payer: Medicare Other

## 2011-05-25 ENCOUNTER — Observation Stay (HOSPITAL_COMMUNITY)
Admission: EM | Admit: 2011-05-25 | Discharge: 2011-05-25 | Disposition: A | Discharge status: Home / Self Care | Payer: Medicare Other | Attending: Internal Medicine | Admitting: Internal Medicine

## 2011-05-25 ENCOUNTER — Other Ambulatory Visit: Payer: Self-pay

## 2011-05-25 DIAGNOSIS — I10 Essential (primary) hypertension: Secondary | ICD-10-CM | POA: Diagnosis present

## 2011-05-25 DIAGNOSIS — D72829 Elevated white blood cell count, unspecified: Secondary | ICD-10-CM | POA: Diagnosis present

## 2011-05-25 DIAGNOSIS — R748 Abnormal levels of other serum enzymes: Secondary | ICD-10-CM

## 2011-05-25 DIAGNOSIS — E039 Hypothyroidism, unspecified: Secondary | ICD-10-CM | POA: Diagnosis present

## 2011-05-25 DIAGNOSIS — R079 Chest pain, unspecified: Principal | ICD-10-CM | POA: Diagnosis present

## 2011-05-25 HISTORY — DX: Hypothyroidism, unspecified: E03.9

## 2011-05-25 HISTORY — DX: Essential (primary) hypertension: I10

## 2011-05-25 HISTORY — DX: Disorder of thyroid, unspecified: E07.9

## 2011-05-25 LAB — CBC
HCT: 40.5 % (ref 36.0–46.0)
Hemoglobin: 13.4 g/dL (ref 12.0–15.0)
MCH: 30.1 pg (ref 26.0–34.0)
MCV: 92.4 fL (ref 78.0–100.0)
Platelets: 271 10*3/uL (ref 150–400)
RBC: 4.41 MIL/uL (ref 3.87–5.11)
RDW: 13.3 % (ref 11.5–15.5)
RDW: 13.5 % (ref 11.5–15.5)
WBC: 11.9 10*3/uL — ABNORMAL HIGH (ref 4.0–10.5)
WBC: 11.2 10*3/uL — ABNORMAL HIGH (ref 4.0–10.5)

## 2011-05-25 LAB — CARDIAC PANEL(CRET KIN+CKTOT+MB+TROPI)
CK, MB: 8.8 ng/mL (ref 0.3–4.0)
Relative Index: 8.1 — ABNORMAL HIGH (ref 0.0–2.5)
Total CK: 124 U/L (ref 7–177)
Total CK: 111 U/L (ref 7–177)
Troponin I: <0.3 ng/mL (ref <0.30)
Troponin I: <0.3 ng/mL (ref <0.30)

## 2011-05-25 LAB — URINALYSIS, ROUTINE W REFLEX MICROSCOPIC
Bilirubin Urine: NEGATIVE
Hgb urine dipstick: NEGATIVE
Ketones, ur: NEGATIVE mg/dL
Protein, ur: NEGATIVE mg/dL
Urobilinogen, UA: 0.2 mg/dL (ref 0.0–1.0)

## 2011-05-25 LAB — BASIC METABOLIC PANEL
BUN: 23 mg/dL (ref 6–23)
Chloride: 98 mEq/L (ref 96–112)
GFR calc Af Amer: 60 mL/min — ABNORMAL LOW (ref >90)
Potassium: 4.1 mEq/L (ref 3.5–5.1)

## 2011-05-25 MED ORDER — ACETAMINOPHEN 650 MG RE SUPP: 650.0000 mg | Freq: Four times a day (QID) | RECTAL | Status: DC | PRN

## 2011-05-25 MED ORDER — LOTEPREDNOL ETABONATE 0.2 % OP SUSP: 1.0000 [drp] | Freq: Three times a day (TID) | OPHTHALMIC | Status: DC

## 2011-05-25 MED ORDER — ONDANSETRON HCL 4 MG/2ML IJ SOLN: 4.0000 mg | Freq: Four times a day (QID) | INTRAMUSCULAR | Status: DC | PRN

## 2011-05-25 MED ORDER — VITAMIN D3 25 MCG (1000 UNIT) PO TABS
1000.0000 [IU] | ORAL_TABLET | Freq: Every day | ORAL | Status: DC
  Filled 2011-05-25 (×2): qty 1

## 2011-05-25 MED ORDER — LEVOTHYROXINE SODIUM 88 MCG PO TABS
88.0000 ug | ORAL_TABLET | Freq: Every day | ORAL | Status: DC
  Filled 2011-05-25 (×2): qty 1

## 2011-05-25 MED ORDER — PANTOPRAZOLE SODIUM 40 MG PO TBEC: 40.0000 mg | DELAYED_RELEASE_TABLET | Freq: Two times a day (BID) | ORAL | Status: DC

## 2011-05-25 MED ORDER — MAGNESIUM HYDROXIDE 400 MG/5ML PO SUSP: 30.0000 mL | Freq: Every day | ORAL | Status: AC

## 2011-05-25 MED ORDER — NITROGLYCERIN 0.4 MG SL SUBL: 0.4000 mg | SUBLINGUAL_TABLET | SUBLINGUAL | Status: DC | PRN

## 2011-05-25 MED ORDER — SODIUM CHLORIDE 0.9 % IV SOLN: 250.0000 mL | INTRAVENOUS | Status: DC

## 2011-05-25 MED ORDER — BISACODYL 10 MG RE SUPP: 10.0000 mg | Freq: Every day | RECTAL | Status: DC | PRN

## 2011-05-25 MED ORDER — MORPHINE SULFATE 2 MG/ML IJ SOLN: 0.5000 mg | INTRAMUSCULAR | Status: DC | PRN

## 2011-05-25 MED ORDER — POLYVINYL ALCOHOL 1.4 % OP SOLN
1.0000 [drp] | Freq: Three times a day (TID) | OPHTHALMIC | Status: DC
  Filled 2011-05-25 (×2): qty 15

## 2011-05-25 MED ORDER — SODIUM CHLORIDE 0.9 % IJ SOLN: 3.0000 mL | INTRAMUSCULAR | Status: DC | PRN

## 2011-05-25 MED ORDER — CALCIUM CARBONATE 1250 (500 CA) MG PO TABS
1.0000 | ORAL_TABLET | Freq: Every day | ORAL | Status: DC
  Filled 2011-05-25 (×2): qty 1

## 2011-05-25 MED ORDER — CALCIUM 1200-1000 MG-UNIT PO CHEW: 1.0000 | CHEWABLE_TABLET | Freq: Every day | ORAL | Status: DC

## 2011-05-25 MED ORDER — POLYETHYL GLYCOL-PROPYL GLYCOL 0.4-0.3 % OP SOLN: Freq: Three times a day (TID) | OPHTHALMIC | Status: DC

## 2011-05-25 MED ORDER — OXYCODONE HCL 5 MG PO TABS: 5.0000 mg | ORAL_TABLET | ORAL | Status: DC | PRN

## 2011-05-25 MED ORDER — ACETAMINOPHEN 325 MG PO TABS: 650.0000 mg | ORAL_TABLET | Freq: Four times a day (QID) | ORAL | Status: DC | PRN

## 2011-05-25 MED ORDER — ASPIRIN 81 MG PO CHEW
81.0000 mg | CHEWABLE_TABLET | Freq: Every day | ORAL | Status: DC
  Filled 2011-05-25: qty 1

## 2011-05-25 MED ORDER — SODIUM CHLORIDE 0.9 % IJ SOLN: 3.0000 mL | Freq: Two times a day (BID) | INTRAMUSCULAR | Status: DC

## 2011-05-25 MED ORDER — ONDANSETRON HCL 4 MG/2ML IJ SOLN
4.0000 mg | Freq: Once | INTRAMUSCULAR | Status: AC
  Administered 2011-05-25: 4 mg via INTRAVENOUS
  Filled 2011-05-25: qty 2

## 2011-05-25 MED ORDER — ASPIRIN 325 MG PO TABS
325.0000 mg | ORAL_TABLET | ORAL | Status: AC
  Administered 2011-05-25: 325 mg via ORAL
  Filled 2011-05-25: qty 1

## 2011-05-25 MED ORDER — ENOXAPARIN SODIUM 40 MG/0.4ML ~~LOC~~ SOLN
40.0000 mg | SUBCUTANEOUS | Status: DC
  Filled 2011-05-25 (×2): qty 0.4

## 2011-05-25 MED ORDER — ONDANSETRON HCL 4 MG PO TABS: 4.0000 mg | ORAL_TABLET | Freq: Four times a day (QID) | ORAL | Status: DC | PRN

## 2011-05-25 MED ORDER — AMLODIPINE BESYLATE 10 MG PO TABS
10.0000 mg | ORAL_TABLET | Freq: Every day | ORAL | Status: DC
  Filled 2011-05-25 (×2): qty 1

## 2011-05-25 MED ORDER — TOLTERODINE TARTRATE 2 MG PO TABS
2.0000 mg | ORAL_TABLET | Freq: Every day | ORAL | Status: DC
  Filled 2011-05-25 (×2): qty 1

## 2011-05-25 MED ORDER — MAGNESIUM HYDROXIDE 400 MG/5ML PO SUSP
30.0000 mL | Freq: Every day | ORAL | Status: DC
Start: 2011-05-25 — End: 2011-05-25

## 2011-05-25 NOTE — H&P (Signed)
PCP:  Dr. Selena Batten  Chief Complaint:  Chest pain  HPI: The patient is a pleasant 77 year old white female with history of hypertension and hypothyroidism who presents with complaints of chest pain. She states that she began having chest pain about 6 hours ago described as, midsternal in location, felt like a tightness 5/10 in intensity, associated with nausea no vomiting, and nonradiating. She denies diaphoresis and no associated shortness of breath. Patient reports that the pain started about 30 minutes after she had eaten a large piece of chocolate pie and she admits to intermittent belching. She denies any alleviating factors, because her pain was persisting she called EMS. Per EDP her pain resolved by the time she arrived the ED, Cardiac markers were in the ED, and EKG did not show any acute ischemic changes. When the patient got up to walk to the bathroom she began having chest pain again which she states only lasted a few seconds, and so she is admitted for further evaluation and management.  Review of Systems:  The patient denies anorexia, fever, weight loss,, vision loss, decreased hearing, hoarseness, chest pain, syncope, dyspnea on exertion, peripheral edema, balance deficits, hemoptysis, abdominal pain, melena, hematochezia, severe indigestion/heartburn, hematuria, incontinence, genital sores, muscle weakness, suspicious skin lesions, transient blindness, difficulty walking, depression, unusual weight change, abnormal bleeding, enlarged lymph nodes, angioedema, and breast masses.  Past Medical History: Past Medical History  Diagnosis Date  . Hypertension   . Thyroid disease   . Hypothyroid    Past Surgical History  Procedure Date  . Brain surgery     Medications: Prior to Admission medications   Medication Sig Start Date End Date Taking? Authorizing Provider  alendronate (FOSAMAX) 70 MG tablet Take 70 mg by mouth every 7 (seven) days. Take with a full glass of water on an  empty stomach. Takes on Sunday.    Yes Historical Provider, MD  amLODipine (NORVASC) 10 MG tablet Take 10 mg by mouth daily.     Yes Historical Provider, MD  Calcium 1200-1000 MG-UNIT CHEW Chew 1 tablet by mouth daily.     Yes Historical Provider, MD  cholecalciferol (VITAMIN D) 1000 UNITS tablet Take 1,000 Units by mouth daily.     Yes Historical Provider, MD  levothyroxine (SYNTHROID, LEVOTHROID) 88 MCG tablet Take 88 mcg by mouth daily.     Yes Historical Provider, MD  loteprednol (LOTEMAX) 0.2 % SUSP Place 1 drop into both eyes 3 (three) times daily.     Yes Historical Provider, MD  meloxicam (MOBIC) 15 MG tablet Take 7.5-15 mg by mouth daily.     Yes Historical Provider, MD  Multiple Vitamins-Minerals (ICAPS PO) Take 1 tablet by mouth daily.     Yes Historical Provider, MD  Polyethyl Glycol-Propyl Glycol (SYSTANE OP) Place 1 drop into both eyes 3 (three) times daily.     Yes Historical Provider, MD  tolterodine (DETROL) 2 MG tablet Take 2 mg by mouth daily.    Yes Historical Provider, MD    Allergies:  No Known Allergies  Social History:  reports that she has never smoked. She does not have any smokeless tobacco history on file. She reports that she does not drink alcohol or use illicit drugs.  Family History: History reviewed. No pertinent family history.  Physical Exam: Filed Vitals:   05/25/11 0201 05/25/11 0203 05/25/11 0346  BP: 157/79  155/74  Temp:   97.8 F (36.6 C)  TempSrc:   Oral  Resp: 32 21 22  Height: 5\' 5"  (1.651 m)    Weight: 63.504 kg (140 lb)    SpO2: 96%  96%   BP 155/74  Temp(Src) 97.8 F (36.6 C) (Oral)  Resp 22  Ht 5\' 5"  (1.651 m)  Wt 63.504 kg (140 lb)  BMI 23.30 kg/m2  SpO2 96%  General Appearance:    Alert & oriented x 3, cooperative, no distress.  Head:    Normocephalic, without obvious abnormality, atraumatic  Eyes:    PERRL, conjunctiva/corneas clear, EOM's intact, fundi    benign, both eyes       Throat:   Lips, mucosa moist, and tongue  normal   Neck:   Supple, symmetrical, trachea midline, no adenopathy;       thyroid:  No enlargement/tenderness/nodules; no carotid   bruit or JVD  Lungs:     Clear to auscultation bilaterally, respirations unlabored  Chest wall:    No tenderness or deformity  Heart:    Regular rate and rhythm, S1 and S2 normal, no murmur, rub   or gallop  Abdomen:     Soft, non-tender, bowel sounds active all four quadrants,    no masses, no organomegaly  Extremities:   Extremities normal, atraumatic, no cyanosis or edema  Pulses:   2+ and symmetric all extremities  Skin:   Skin color, texture, turgor normal, no rashes or lesions  Neurologic:   CNII-XII intact. Normal strength, sensory grossly intact, nonfocal.        Labs on Admission:   [facility] 05/25/11 0225  NA 139  K 4.1  CL 98  CO2 31  GLUCOSE 127*  BUN 23  CREATININE 0.93  CALCIUM 10.5  MG --  PHOS --   No results found for this basename: AST:2,ALT:2,ALKPHOS:2,BILITOT:2,PROT:2,ALBUMIN:2 in the last 72 hours No results found for this basename: LIPASE:2,AMYLASE:2 in the last 72 hours  Basename 05/25/11 0225  WBC 11.9*  NEUTROABS --  HGB 13.4  HCT 40.5  MCV 91.8  PLT 284    Basename 05/25/11 0227  CKTOTAL 124  CKMB 10.0*  CKMBINDEX --  TROPONINI <0.30   No results found for this basename: TSH,T4TOTAL,FREET3,T3FREE,THYROIDAB in the last 72 hours No results found for this basename: VITAMINB12:2,FOLATE:2,FERRITIN:2,TIBC:2,IRON:2,RETICCTPCT:2 in the last 72 hours  Radiological Exams on Admission: Dg Chest 2 View  05/25/2011  *RADIOLOGY REPORT*  Clinical Data: Chest pain.  CHEST - 2 VIEW  Comparison: Chest radiograph performed 02/15/2005  Findings: The lungs are well-aerated.  Mild bibasilar atelectasis or scarring is noted.  There is no evidence of pleural effusion or pneumothorax.  Density at the right midlung zone may reflect a pleural plaque.  The heart is normal in size; the mediastinal contour is within normal limits.  No  acute osseous abnormalities are seen.  IMPRESSION: Mild bibasilar atelectasis or scarring noted; density at the right midlung zone may reflect a pleural plaque.  Original Report Authenticated By: Tonia Ghent, M.D.    Assessment/Plan Present on Admission:  .Chest pain, unspecified- as discussed above, we'll cycle cardiac place on aspirin and when necessary nitroglycerin. As stated above her symptoms started about after eating, and her she is also on a Cox inhibitor/meloxicam- so possibly GI related. Placed on a PPI. follow cardiac enzymes and consult cardiology if elevated for recommendations.  .Leukocytosis-mild, check a urinalysis and and repeat chest x-ray in a.m.  Marland KitchenHypertension-continue outpatient medications the  .Hypothyroid- continue Synthroid .Right mid lung density- ? pleural plaque, repeat chest x-ray in the a.m. and further manage accordingly.  Shalawn Wynder C  05/25/2011, 5:34 AM

## 2011-05-25 NOTE — Progress Notes (Signed)
Reviewed medications and discharge instructions with pt and her sister.  Pt verbalized understanding of instructions.  Follow up appointments in place.  Thomas Hoff

## 2011-05-25 NOTE — ED Notes (Signed)
Pt denies any pain, sob or nausea at this time.

## 2011-05-25 NOTE — Progress Notes (Signed)
Utilization Review Complete  

## 2011-05-25 NOTE — Discharge Summary (Signed)
[name], 77 y.o., DOB 1918-01-22, MRN [id number]. Admission date: 05/25/2011 Discharge Date 05/25/2011 Primary MD No primary provider on file. Admitting Physician Leroy Sea, MD  Admission Diagnosis  Chest pain [786.5] Hypertension [401.9] Hypothyroid [244.9] Cardiac enzymes elevated [790.5] CHEST PAIN   Discharge Diagnosis   Active Problems:  Hypertension  Hypothyroid  Chest pain, unspecified  Leukocytosis       Past Medical History  Diagnosis Date  . Hypertension   . Thyroid disease   . Hypothyroid     Past Surgical History  Procedure Date  . Brain surgery     Consults  None  Significant Tests:  See full reports for all details     Dg Chest 2 View  05/25/2011  *RADIOLOGY REPORT*  Clinical Data: Chest pain.  CHEST - 2 VIEW  Comparison: Chest radiograph performed 02/15/2005  Findings: The lungs are well-aerated.  Mild bibasilar atelectasis or scarring is noted.  There is no evidence of pleural effusion or pneumothorax.  Density at the right midlung zone may reflect a pleural plaque.  The heart is normal in size; the mediastinal contour is within normal limits.  No acute osseous abnormalities are seen.  IMPRESSION: Mild bibasilar atelectasis or scarring noted; density at the right midlung zone may reflect a pleural plaque.  Original Report Authenticated By: Tonia Ghent, M.D.    Hospital Course See H&P, Labs, Consult and Test reports for all details in brief, patient was admitted for Substernal Burning chest pain X 2 both times after meals, relieved by belching, Stable EKG, Trop -ve over 12 hrs 2 sets, CKmb falsely +ve in presence of NML CK levels these were D/W Dr T.Wall Cards, CXR no acute changes. Her symptoms were  likely has GERD, will place her on PPI and follow with GI outpt, if symptoms continue Home Meds may need to be titrated.   Will request PCP to check CBC for non specific Lekocytosis and CXR for ? Lung Scar.  Patient really wants to go home  soon and does not want any further testing which I don't think is needed anyways inaptient.  Today   Subjective:   [name] today has no headache,no chest abdominal pain,no new weakness tingling or numbness, feels much better wants to go home today.    Objective:   Blood pressure 128/76, pulse 85, temperature 98.1 F (36.7 C), temperature source Oral, resp. rate 18, height 5\' 5"  (1.651 m), weight 57.97 kg (127 lb 12.8 oz), SpO2 95.00%. No intake or output data in the 24 hours ending 05/25/11 1632  Exam Awake Alert, Oriented *3, No new F.N deficits, Normal affect [place].AT,PERRAL Supple Neck,No JVD, No cervical lymphadenopathy appriciated.  Symmetrical Chest wall movement, Good air movement bilaterally, CTAB RRR,No Gallops,Rubs or new Murmurs, No Parasternal Heave +ve B.Sounds, Abd Soft, Non tender, No organomegaly appriciated, No rebound -guarding or rigidity. No Cyanosis, Clubbing or edema, No new Rash or bruise  Data Review   CBC w Diff:  Lab Results  Component Value Date   WBC 11.2* 05/25/2011   HGB 11.9* 05/25/2011   HCT 36.5 05/25/2011   PLT 271 05/25/2011   CMP:  Lab Results  Component Value Date   NA 139 05/25/2011   K 4.1 05/25/2011   CL 98 05/25/2011   CO2 31 05/25/2011   BUN 23 05/25/2011   CREATININE 0.93 05/25/2011  . Lab Results  Component Value Date   CKTOTAL 111 05/25/2011   CKMB 8.8* 05/25/2011   TROPONINI <0.30 05/25/2011  Results for Paula, [name] (MRN [id number]) as of 05/25/2011 16:28  Ref. Range 05/25/2011 11:30  Color, Urine Latest Range: YELLOW  YELLOW  Appearance Latest Range: CLEAR  CLEAR  Specific Gravity, Urine Latest Range: 1.005-1.030  1.009  pH Latest Range: 5.0-8.0  6.5  Glucose, UA Latest Range: NEGATIVE mg/dL NEGATIVE  Bilirubin Urine Latest Range: NEGATIVE  NEGATIVE  Ketones, ur Latest Range: NEGATIVE mg/dL NEGATIVE  Protein Latest Range: NEGATIVE mg/dL NEGATIVE  Urobilinogen, UA Latest Range: 0.0-1.0 mg/dL 0.2   Nitrite Latest Range: NEGATIVE  NEGATIVE  Leukocytes, UA Latest Range: NEGATIVE  NEGATIVE    Discharge Instructions     Follow with Primary MD DR Orie Rout in 7 days   Get CBC, CMP, TSH checked 7 days by Primary MD and again as instructed by your Primary MD.   Get a 2 view Chest X ray done next visit.  Get Medicines reviewed and adjusted.  Please request your Prim.MD to go over all Hospital Tests and Procedure/Radiological results at the follow up, please get all Hospital records sent to your Prim MD by signing hospital release before you go home.  Activity: Fall precautions use walker/cane & assistance as needed  Diet: Heart Helathy  For Heart failure patients - Check your Weight same time everyday, if you gain over 2 pounds, or you develop in leg swelling, experience more shortness of breath or chest pain, call your Primary MD immediately. Follow Cardiac Low Salt Diet and 1.8 lit/day fluid restriction.  Disposition Home  If you experience worsening of your admission symptoms, develop shortness of breath, life threatening emergency, suicidal or homicidal thoughts you must seek medical attention immediately by calling 911 or calling your MD immediately  if symptoms less severe.  You Must read complete instructions/literature along with all the possible adverse reactions/side effects for all the Medicines you take and that have been prescribed to you. Take any new Medicines after you have completely understood and accpet all the possible adverse reactions/side effects.   Wear Seat belts while driving.    Follow-up Information    Follow up with [facility], MD. Make an appointment in 3 days.   Contact information:   [address], [address] [place] [address] [phone]       Follow up with Florencia Reasons, MD. Make an appointment in 1 week.   Contact information:   [address], [address] [employer], [place]. [place] [address] [phone]          Discharge Medications   Medication List  As of 05/25/2011  4:32 PM   START taking these medications         magnesium hydroxide 400 MG/5ML suspension   Commonly known as: MILK OF MAGNESIA   Take 30 mLs by mouth daily.      pantoprazole 40 MG tablet   Commonly known as: PROTONIX   Take 1 tablet (40 mg total) by mouth 2 (two) times daily before a meal.         CONTINUE taking these medications         alendronate 70 MG tablet   Commonly known as: FOSAMAX      amLODipine 10 MG tablet   Commonly known as: NORVASC      Calcium 1200-1000 MG-UNIT Chew      cholecalciferol 1000 UNITS tablet   Commonly known as: VITAMIN D      ICAPS PO      levothyroxine 88 MCG tablet  Commonly known as: SYNTHROID, LEVOTHROID      loteprednol 0.2 % Susp   Commonly known as: LOTEMAX      meloxicam 15 MG tablet   Commonly known as: MOBIC      SYSTANE OP      tolterodine 2 MG tablet   Commonly known as: DETROL          Where to get your medications    These are the prescriptions that you need to pick up.   You may get these medications from any pharmacy.         magnesium hydroxide 400 MG/5ML suspension   pantoprazole 40 MG tablet            Total Time in preparing paper work, data evaluation and todays exam - 35 minutes  Signed: Leroy Sea 05/25/2011 4:32 PM  The patient was admitted to the Hospitalist Service.

## 2011-05-25 NOTE — ED Notes (Signed)
Pt denies any pain at this time and would like to go home.  Notified that we are still awaiting test results

## 2011-05-25 NOTE — ED Notes (Signed)
Attempted to call report.  RN Chelsea taking pt to OR.  Will call in 10 minutes.

## 2011-05-25 NOTE — ED Notes (Signed)
Pt assisted up to toilet. States chest pain lasting 30 sec while in bathroom and increased nausea.  Dr Norlene Campbell notified.

## 2011-05-25 NOTE — ED Notes (Signed)
Pt called EMS stating that she begin experiencing sternal chest pain around 10 pm while sitting watching tv.  Pt non diaphoretic, but did experience nausea and she felt like burping.

## 2011-05-25 NOTE — ED Provider Notes (Signed)
History     CSN: [id number] Arrival date & time: 05/25/2011  1:55 AM   First MD Initiated Contact with Patient 05/25/11 0211      Chief Complaint  Patient presents with  . Chest Pain    (Consider location/radiation/quality/duration/timing/severity/associated sxs/prior treatment) HPI 77 year old female presents to the emergency room with complaint of substernal chest pain. Patient reports onset of pain around 6 PM. Pain was associated with nausea no vomiting no shortness of breath no diaphoresis. Patient denies previous history of similar pain. Pain persisted until she called 911. Patient reports chest pain resolved during her trip to the arm. Patient did have return of chest pain since being here in the ER when she walked to the bathroom. Patient with history of hypertension no prior cardiac history known. Patient denies smoking or family history of cardiac disease Past Medical History  Diagnosis Date  . Hypertension   . Thyroid disease     Past Surgical History  Procedure Date  . Brain surgery     History reviewed. No pertinent family history.  History  Substance Use Topics  . Smoking status: Never Smoker   . Smokeless tobacco: Not on file  . Alcohol Use: No    OB History    Grav Para Term Preterm Abortions TAB SAB Ect Mult Living                  Review of Systems  All other systems reviewed and are negative.    Allergies  Review of patient's allergies indicates no known allergies.  Home Medications   Current Outpatient Rx  Name Route Sig Dispense Refill  . ALENDRONATE SODIUM 70 MG PO TABS Oral Take 70 mg by mouth every 7 (seven) days. Take with a full glass of water on an empty stomach. Takes on Sunday.     Marland Kitchen AMLODIPINE BESYLATE 10 MG PO TABS Oral Take 10 mg by mouth daily.      Marland Kitchen CALCIUM 1200-1000 MG-UNIT PO CHEW Oral Chew 1 tablet by mouth daily.      Marland Kitchen VITAMIN D 1000 UNITS PO TABS Oral Take 1,000 Units by mouth daily.      Marland Kitchen LEVOTHYROXINE SODIUM 88  MCG PO TABS Oral Take 88 mcg by mouth daily.      Marland Kitchen LOTEPREDNOL ETABONATE 0.2 % OP SUSP Both Eyes Place 1 drop into both eyes 3 (three) times daily.      . MELOXICAM 15 MG PO TABS Oral Take 7.5-15 mg by mouth daily.      . ICAPS PO Oral Take 1 tablet by mouth daily.      Frazier Butt OP Both Eyes Place 1 drop into both eyes 3 (three) times daily.      . TOLTERODINE TARTRATE 2 MG PO TABS Oral Take 2 mg by mouth daily.       BP 155/74  Temp(Src) 97.8 F (36.6 C) (Oral)  Resp 22  Ht 5\' 5"  (1.651 m)  Wt 140 lb (63.504 kg)  BMI 23.30 kg/m2  SpO2 96%  Physical Exam  Nursing note and vitals reviewed. Constitutional: She is oriented to person, place, and time. She appears well-developed and well-nourished.  HENT:  Head: Normocephalic and atraumatic.  Right Ear: External ear normal.  Left Ear: External ear normal.  Nose: Nose normal.  Mouth/Throat: Oropharynx is clear and moist.  Eyes: Conjunctivae and EOM are normal. Pupils are equal, round, and reactive to light.  Neck: Normal range of motion. Neck supple. No JVD present. No  tracheal deviation present. No thyromegaly present.  Cardiovascular: Normal rate, regular rhythm, normal heart sounds and intact distal pulses.  Exam reveals no gallop and no friction rub.   No murmur heard. Pulmonary/Chest: Effort normal and breath sounds normal. No stridor. No respiratory distress. She has no wheezes. She has no rales. She exhibits no tenderness.  Abdominal: Soft. Bowel sounds are normal. She exhibits no distension and no mass. There is no tenderness. There is no rebound and no guarding.  Musculoskeletal: Normal range of motion. She exhibits no edema and no tenderness.  Lymphadenopathy:    She has no cervical adenopathy.  Neurological: She is oriented to person, place, and time. She has normal reflexes. No cranial nerve deficit. She exhibits normal muscle tone. Coordination normal.  Skin: Skin is dry. No rash noted. No erythema. No pallor.    Psychiatric: She has a normal mood and affect. Her behavior is normal. Judgment and thought content normal.    ED Course  Procedures (including critical care time)  Labs Reviewed  CBC - Abnormal; Notable for the following:    WBC 11.9 (*)    All other components within normal limits  BASIC METABOLIC PANEL - Abnormal; Notable for the following:    Glucose, Bld 127 (*)    GFR calc non Af Amer 52 (*)    GFR calc Af Amer 60 (*)    All other components within normal limits  CARDIAC PANEL(CRET KIN+CKTOT+MB+TROPI) - Abnormal; Notable for the following:    CK, MB 10.0 (*)    Relative Index 8.1 (*)    All other components within normal limits  POCT I-STAT TROPONIN I   Dg Chest 2 View  05/25/2011  *RADIOLOGY REPORT*  Clinical Data: Chest pain.  CHEST - 2 VIEW  Comparison: Chest radiograph performed 02/15/2005  Findings: The lungs are well-aerated.  Mild bibasilar atelectasis or scarring is noted.  There is no evidence of pleural effusion or pneumothorax.  Density at the right midlung zone may reflect a pleural plaque.  The heart is normal in size; the mediastinal contour is within normal limits.  No acute osseous abnormalities are seen.  IMPRESSION: Mild bibasilar atelectasis or scarring noted; density at the right midlung zone may reflect a pleural plaque.  Original Report Authenticated By: Tonia Ghent, M.D.    Date: 05/25/2011  Rate: 87  Rhythm: normal sinus rhythm  QRS Axis: normal  Intervals: PR prolonged  ST/T Wave abnormalities: normal  Conduction Disutrbances:none  Narrative Interpretation:   Old EKG Reviewed: unchanged   No diagnosis found.    MDM  77 year old female with chest pain but minimal risk factors aside from hypertension for acute coronary syndrome. Patient pain free at present. Patient's cardiac markers have come back with elevated CK-MB however, and given patient's age and that she lives alone we'll discuss with her and hospitalist for admission for chest pain  rule out        Olivia Mackie, MD 05/25/11 [phone]

## 2011-05-26 LAB — URINE CULTURE: Culture: NO GROWTH

## 2011-05-26 NOTE — Progress Notes (Signed)
   CARE MANAGEMENT NOTE 05/26/2011  Patient:  [name], [name]   Account Number:  [id number]  Date Initiated:  05/26/2011  Documentation initiated by:  Onnie Boer  Subjective/Objective Assessment:   PT WAS ADMITTED WITH  CP     Action/Plan:   PROGRESSION OF CARE AND DISCHARGE PLANNING   Anticipated DC Date:  05/25/2011   Anticipated DC Plan:  HOME/SELF CARE      DC Planning Services  CM consult      Choice offered to / List presented to:             Status of service:  Completed, signed off Medicare Important Message given?   (If response is "NO", the following Medicare IM given date fields will be blank) Date Medicare IM given:   Date Additional Medicare IM given:    Discharge Disposition:  HOME/SELF CARE  Per UR Regulation:  Reviewed for med. necessity/level of care/duration of stay  Comments:  05/26/2011 Onnie Boer, RN, BSN 1121 PT DC'D TO HOME WITH SELF CARE

## 2011-08-10 DIAGNOSIS — R350 Frequency of micturition: Secondary | ICD-10-CM | POA: Diagnosis not present

## 2011-08-10 DIAGNOSIS — IMO0001 Reserved for inherently not codable concepts without codable children: Secondary | ICD-10-CM | POA: Diagnosis not present

## 2011-08-12 ENCOUNTER — Other Ambulatory Visit: Payer: Self-pay | Admitting: Family Medicine

## 2011-08-12 DIAGNOSIS — M545 Low back pain: Secondary | ICD-10-CM

## 2011-08-12 DIAGNOSIS — R350 Frequency of micturition: Secondary | ICD-10-CM | POA: Diagnosis not present

## 2011-08-13 ENCOUNTER — Ambulatory Visit
Admission: RE | Admit: 2011-08-13 | Discharge: 2011-08-13 | Disposition: A | Discharge status: Home / Self Care | Payer: Medicare Other | Source: Ambulatory Visit | Attending: Family Medicine | Admitting: Family Medicine

## 2011-08-13 DIAGNOSIS — M545 Low back pain: Secondary | ICD-10-CM

## 2011-08-13 DIAGNOSIS — M533 Sacrococcygeal disorders, not elsewhere classified: Secondary | ICD-10-CM | POA: Diagnosis not present

## 2011-08-14 ENCOUNTER — Encounter (HOSPITAL_COMMUNITY): Payer: Self-pay | Admitting: Emergency Medicine

## 2011-08-14 ENCOUNTER — Emergency Department (HOSPITAL_COMMUNITY)
Admission: EM | Admit: 2011-08-14 | Discharge: 2011-08-14 | Disposition: A | Discharge status: Home / Self Care | Payer: Medicare Other | Attending: Emergency Medicine | Admitting: Emergency Medicine

## 2011-08-14 DIAGNOSIS — S3210XA Unspecified fracture of sacrum, initial encounter for closed fracture: Secondary | ICD-10-CM | POA: Diagnosis not present

## 2011-08-14 DIAGNOSIS — M545 Low back pain, unspecified: Secondary | ICD-10-CM | POA: Diagnosis not present

## 2011-08-14 DIAGNOSIS — R32 Unspecified urinary incontinence: Secondary | ICD-10-CM | POA: Insufficient documentation

## 2011-08-14 DIAGNOSIS — M79609 Pain in unspecified limb: Secondary | ICD-10-CM | POA: Diagnosis not present

## 2011-08-14 DIAGNOSIS — Z79899 Other long term (current) drug therapy: Secondary | ICD-10-CM | POA: Diagnosis not present

## 2011-08-14 DIAGNOSIS — R011 Cardiac murmur, unspecified: Secondary | ICD-10-CM | POA: Insufficient documentation

## 2011-08-14 DIAGNOSIS — E039 Hypothyroidism, unspecified: Secondary | ICD-10-CM | POA: Insufficient documentation

## 2011-08-14 DIAGNOSIS — IMO0001 Reserved for inherently not codable concepts without codable children: Secondary | ICD-10-CM | POA: Insufficient documentation

## 2011-08-14 DIAGNOSIS — I10 Essential (primary) hypertension: Secondary | ICD-10-CM | POA: Insufficient documentation

## 2011-08-14 DIAGNOSIS — R109 Unspecified abdominal pain: Secondary | ICD-10-CM | POA: Diagnosis not present

## 2011-08-14 DIAGNOSIS — S322XXA Fracture of coccyx, initial encounter for closed fracture: Secondary | ICD-10-CM | POA: Diagnosis not present

## 2011-08-14 DIAGNOSIS — K5900 Constipation, unspecified: Secondary | ICD-10-CM | POA: Diagnosis not present

## 2011-08-14 DIAGNOSIS — W19XXXA Unspecified fall, initial encounter: Secondary | ICD-10-CM | POA: Insufficient documentation

## 2011-08-14 DIAGNOSIS — R110 Nausea: Secondary | ICD-10-CM | POA: Diagnosis not present

## 2011-08-14 DIAGNOSIS — R141 Gas pain: Secondary | ICD-10-CM | POA: Diagnosis not present

## 2011-08-14 DIAGNOSIS — R142 Eructation: Secondary | ICD-10-CM | POA: Insufficient documentation

## 2011-08-14 LAB — URINALYSIS, ROUTINE W REFLEX MICROSCOPIC
Bilirubin Urine: NEGATIVE
Glucose, UA: NEGATIVE mg/dL
Hgb urine dipstick: NEGATIVE
Specific Gravity, Urine: 1.009 (ref 1.005–1.030)
Urobilinogen, UA: 0.2 mg/dL (ref 0.0–1.0)
pH: 6.5 (ref 5.0–8.0)

## 2011-08-14 LAB — BASIC METABOLIC PANEL
BUN: 14 mg/dL (ref 6–23)
CO2: 24 mEq/L (ref 19–32)
GFR calc non Af Amer: 60 mL/min — ABNORMAL LOW (ref >90)
Glucose, Bld: 117 mg/dL — ABNORMAL HIGH (ref 70–99)
Potassium: 3.3 mEq/L — ABNORMAL LOW (ref 3.5–5.1)

## 2011-08-14 LAB — CBC
MCH: 30 pg (ref 26.0–34.0)
MCHC: 32.5 g/dL (ref 30.0–36.0)
MCV: 92.3 fL (ref 78.0–100.0)
Platelets: 351 10*3/uL (ref 150–400)
RDW: 13.5 % (ref 11.5–15.5)

## 2011-08-14 LAB — DIFFERENTIAL
Basophils Relative: 0 % (ref 0–1)
Eosinophils Absolute: 0.2 10*3/uL (ref 0.0–0.7)
Eosinophils Relative: 2 % (ref 0–5)
Neutrophils Relative %: 74 % (ref 43–77)

## 2011-08-14 MED ORDER — OXYCODONE-ACETAMINOPHEN 5-325 MG PO TABS
1.0000 | ORAL_TABLET | Freq: Once | ORAL | Status: AC
  Administered 2011-08-14: 1 via ORAL
  Filled 2011-08-14: qty 1

## 2011-08-14 MED ORDER — OXYCODONE-ACETAMINOPHEN 5-325 MG PO TABS: 2.0000 | ORAL_TABLET | ORAL | Status: DC | PRN

## 2011-08-14 MED ORDER — SODIUM CHLORIDE 0.9 % IV SOLN
INTRAVENOUS | Status: DC
  Administered 2011-08-14: 16:00:00 via INTRAVENOUS

## 2011-08-14 NOTE — ED Notes (Signed)
16 french indwelling foley catheter inserted without difficulty; approx 200 cc cloudy yellow urine obtained in drainage bag; specimen collected for u/a; foley left intact; tubing secured to left thigh with clear tape; pt tolerated procedure well

## 2011-08-14 NOTE — ED Provider Notes (Signed)
History     CSN: [id number]  Arrival date & time 08/14/11  1519   First MD Initiated Contact with Patient 08/14/11 1545      Chief Complaint  Patient presents with  . Abdominal Pain    (Consider location/radiation/quality/duration/timing/severity/associated sxs/prior treatment) Patient is a 78 y.o. female presenting with abdominal pain. The history is provided by the patient.  Abdominal Pain The primary symptoms of the illness include nausea. The primary symptoms of the illness do not include abdominal pain, fever, shortness of breath, vomiting, diarrhea or dysuria.  Additional symptoms associated with the illness include constipation and back pain. Symptoms associated with the illness do not include chills, urgency or hematuria.   the patient is a 78 year old, female, who states that proximally 10 days ago.  She fell onto her bottom.  She was able to walk since then.  However, 5 days after her fall.  She developed severe pain, mostly in her buttocks and in the right leg, so she has been less ambulatory.  She also states that she has had incontinence of urination.  She denies nausea, vomiting, fevers, chills.  She does not have any dysuria.  She has not been eating because she doesn't feel well.  She has not had a bowel movement in 3 days.  She went to see her primary care physician, who ordered MRI is over sacrum and pelvis.  These showed that she has a nondisplaced sacral fracture, but there was no evidence of spinal cord compression.  Also noted was bladder distention without an identifiable cause.  Her physician, called her and told her to come to the emergency department for further evaluation.  The patient denies cough, shortness of breath, and abdominal pain.  Past Medical History  Diagnosis Date  . Hypertension   . Thyroid disease   . Hypothyroid     Past Surgical History  Procedure Date  . Brain surgery     No family history on file.  History  Substance Use Topics  .  Smoking status: Never Smoker   . Smokeless tobacco: Not on file  . Alcohol Use: No    OB History    Grav Para Term Preterm Abortions TAB SAB Ect Mult Living                  Review of Systems  Constitutional: Negative for fever and chills.  Respiratory: Negative for cough and shortness of breath.   Cardiovascular: Negative for chest pain.  Gastrointestinal: Positive for nausea and constipation. Negative for vomiting, abdominal pain and diarrhea.  Genitourinary: Positive for decreased urine volume and difficulty urinating. Negative for dysuria, urgency and hematuria.  Musculoskeletal: Positive for back pain.  Neurological: Negative for weakness, numbness and headaches.  Psychiatric/Behavioral: Negative for confusion.  All other systems reviewed and are negative.    Allergies  Review of patient's allergies indicates no known allergies.  Home Medications   Current Outpatient Rx  Name Route Sig Dispense Refill  . ALENDRONATE SODIUM 70 MG PO TABS Oral Take 70 mg by mouth every 7 (seven) days. Take with a full glass of water on an empty stomach. Takes on Sunday.     Marland Kitchen AMLODIPINE BESYLATE 10 MG PO TABS Oral Take 10 mg by mouth daily.      Marland Kitchen CALCIUM 1200-1000 MG-UNIT PO CHEW Oral Chew 1 tablet by mouth daily.      Marland Kitchen VITAMIN D 1000 UNITS PO TABS Oral Take 1,000 Units by mouth daily.      Marland Kitchen  LEVOTHYROXINE SODIUM 88 MCG PO TABS Oral Take 88 mcg by mouth daily.      Marland Kitchen LOTEPREDNOL ETABONATE 0.2 % OP SUSP Both Eyes Place 1 drop into both eyes 3 (three) times daily.      . ICAPS PO Oral Take 1 tablet by mouth daily.      Marland Kitchen PANTOPRAZOLE SODIUM 40 MG PO TBEC Oral Take 1 tablet (40 mg total) by mouth 2 (two) times daily before a meal. 30 tablet 0  . SYSTANE OP Both Eyes Place 1 drop into both eyes 3 (three) times daily.      . TOLTERODINE TARTRATE 2 MG PO TABS Oral Take 2 mg by mouth daily.       BP 173/80  Pulse 64  Temp(Src) 97.5 F (36.4 C) (Oral)  Resp 18  SpO2 97%  Physical Exam    Constitutional: She is oriented to person, place, and time. She appears well-developed and well-nourished.  HENT:  Head: Normocephalic and atraumatic.  Eyes: Conjunctivae are normal. Pupils are equal, round, and reactive to light.  Neck: Normal range of motion. Neck supple.  Cardiovascular: Normal rate.   Murmur heard. Pulmonary/Chest: Effort normal. No respiratory distress.  Abdominal: Soft. Bowel sounds are normal. She exhibits no distension. There is tenderness. There is no rebound and no guarding.       Minimal abdominal tenderness without peritoneal signs  Musculoskeletal: Normal range of motion. She exhibits no edema.  Neurological: She is alert and oriented to person, place, and time. No cranial nerve deficit.       Strength 5 over 5 in the lower extremities bilaterally  Skin: Skin is warm and dry.  Psychiatric: She has a normal mood and affect.    ED Course  Procedures (including critical care time)   Labs Reviewed  CBC  DIFFERENTIAL  BASIC METABOLIC PANEL  URINALYSIS, ROUTINE W REFLEX MICROSCOPIC   Mr Lumbar Spine Wo Contrast  08/13/2011  *RADIOLOGY REPORT*  Clinical Data: Status post fall 2 weeks ago with severe low back pain to the coccyx.  Worsened incontinence.  MRI LUMBAR SPINE WITHOUT CONTRAST  Technique:  Multiplanar and multiecho pulse sequences of the lumbar spine were obtained without intravenous contrast.  Comparison: Plain films of the lumbar spine and sacrum 08/10/2011.  Findings: Patient motion somewhat degrades the study.  There is partial visualization of a fracture of the sacrum seen in the S2 and S3 segments. Visualized portion of the fracture is nondisplaced.  No other fracture is identified. The patient has 0.5 cm of anterolisthesis of L4 on L5 due to facet arthropathy. Alignment is otherwise unremarkable. Convex left scoliosis is noted.  The conus medullaris is normal signal and position.  Imaged intra-abdominal contents demonstrate marked distention of the  urinary bladder.  The T10-11, T11-12 and T12-L1 levels imaged in the sagittal plane only and negative.  L1-2:  Negative.  L2-3:  Facet degenerative disease and mild disc bulge without central canal or foraminal stenosis.  L3-4:  Disc bulge and facet arthropathy identified.  Central canal is mildly to moderately narrowed.  Foramina appear open.  L4-5:  The disc is uncovered and bulging.  Ligamentum flavum thickening and facet arthropathy noted.  There is severe central canal and lateral recess stenosis.  The foramina appear open.  L5-S1:  Disc bulge with some facet degenerative disease.  Central canal and foramina appear open.  IMPRESSION: 1.  Partial visualization of an acute/subacute sacral fracture. Visualized portion of the fracture appears nondisplaced. 2.  Marked distention  of the urinary bladder.  Cause of distention is not discretely visualized.  Cause for distention is not identified. 3.  Degenerative disease appearing worst at L4-5 where there is severe central canal and lateral recess stenosis.  Original Report Authenticated By: Bernadene Bell. Maricela Curet, M.D.     No diagnosis found.  Explained findings.  Patient wants to go home.  She is still ambulatory.  There is no evidence of cord compression causing urinary retention.  There is no evidence of urinary tract infection, or other significant illness.  MDM  Sacral fracture.  No cord compression, and no lower extremity weakness.  Urinary retention, without signs of infection.        Nicholes Stairs, MD 08/14/11 1744

## 2011-08-14 NOTE — ED Notes (Signed)
Had an [employer] , has been having lower from  Sacral fx from 2 weeks ago also has spinal stenosis  has had  Been having loss of control  Back pain hurt to stand and walk . Dr Lois Huxley neil office sent her here  The office [facility] to [facility] to admit

## 2011-08-14 NOTE — ED Notes (Signed)
Meal given per pt request   

## 2011-08-14 NOTE — Discharge Instructions (Signed)
Your MRI, shows a nondisplaced fracture of your sacrum.  There is no evidence of cord compression.  Your blood and urine tests do not show any signs of significant illness.  Use Percocet for pain.  Followup with your doctor next week for reevaluation.  Return for worse or uncontrolled symptoms

## 2011-08-14 NOTE — ED Notes (Signed)
Indwelling foley d/c'd; tubing intact; pt tolerated procedure well

## 2011-08-16 ENCOUNTER — Observation Stay (HOSPITAL_COMMUNITY)
Admission: EM | Admit: 2011-08-16 | Discharge: 2011-08-19 | Disposition: A | Discharge status: Home / Self Care | Payer: Medicare Other | Source: Ambulatory Visit | Attending: Internal Medicine | Admitting: Internal Medicine

## 2011-08-16 ENCOUNTER — Encounter (HOSPITAL_COMMUNITY): Payer: Self-pay | Admitting: *Deleted

## 2011-08-16 DIAGNOSIS — R339 Retention of urine, unspecified: Secondary | ICD-10-CM | POA: Diagnosis not present

## 2011-08-16 DIAGNOSIS — M545 Low back pain: Secondary | ICD-10-CM | POA: Diagnosis not present

## 2011-08-16 DIAGNOSIS — I10 Essential (primary) hypertension: Secondary | ICD-10-CM | POA: Diagnosis not present

## 2011-08-16 DIAGNOSIS — E039 Hypothyroidism, unspecified: Secondary | ICD-10-CM | POA: Diagnosis not present

## 2011-08-16 DIAGNOSIS — M549 Dorsalgia, unspecified: Secondary | ICD-10-CM | POA: Diagnosis present

## 2011-08-16 DIAGNOSIS — W19XXXA Unspecified fall, initial encounter: Secondary | ICD-10-CM | POA: Insufficient documentation

## 2011-08-16 DIAGNOSIS — S3210XA Unspecified fracture of sacrum, initial encounter for closed fracture: Secondary | ICD-10-CM | POA: Diagnosis not present

## 2011-08-16 DIAGNOSIS — IMO0002 Reserved for concepts with insufficient information to code with codable children: Secondary | ICD-10-CM | POA: Diagnosis not present

## 2011-08-16 DIAGNOSIS — S322XXA Fracture of coccyx, initial encounter for closed fracture: Secondary | ICD-10-CM | POA: Diagnosis not present

## 2011-08-16 LAB — CBC
HCT: 39.3 % (ref 36.0–46.0)
Hemoglobin: 13.1 g/dL (ref 12.0–15.0)
MCH: 30.7 pg (ref 26.0–34.0)
MCV: 92 fL (ref 78.0–100.0)
RBC: 4.27 MIL/uL (ref 3.87–5.11)
WBC: 8.8 10*3/uL (ref 4.0–10.5)

## 2011-08-16 LAB — BASIC METABOLIC PANEL
CO2: 23 mEq/L (ref 19–32)
Calcium: 9.3 mg/dL (ref 8.4–10.5)
Chloride: 100 mEq/L (ref 96–112)
Creatinine, Ser: 0.94 mg/dL (ref 0.50–1.10)
Glucose, Bld: 88 mg/dL (ref 70–99)

## 2011-08-16 LAB — URINALYSIS, ROUTINE W REFLEX MICROSCOPIC
Glucose, UA: NEGATIVE mg/dL
Hgb urine dipstick: NEGATIVE
Ketones, ur: 15 mg/dL — AB
Leukocytes, UA: NEGATIVE
Protein, ur: NEGATIVE mg/dL
pH: 6.5 (ref 5.0–8.0)

## 2011-08-16 MED ORDER — PANTOPRAZOLE SODIUM 40 MG PO TBEC
40.0000 mg | DELAYED_RELEASE_TABLET | Freq: Two times a day (BID) | ORAL | Status: DC
  Administered 2011-08-17 – 2011-08-19 (×5): 40 mg via ORAL
  Filled 2011-08-16 (×6): qty 1

## 2011-08-16 MED ORDER — BIMATOPROST 0.01 % OP SOLN
1.0000 [drp] | Freq: Every day | OPHTHALMIC | Status: DC
  Administered 2011-08-16 – 2011-08-19 (×2): 1 [drp] via OPHTHALMIC
  Filled 2011-08-16: qty 2.5

## 2011-08-16 MED ORDER — ALUM & MAG HYDROXIDE-SIMETH 200-200-20 MG/5ML PO SUSP: 30.0000 mL | Freq: Four times a day (QID) | ORAL | Status: DC | PRN

## 2011-08-16 MED ORDER — METHOCARBAMOL 100 MG/ML IJ SOLN
500.0000 mg | Freq: Three times a day (TID) | INTRAVENOUS | Status: DC | PRN
  Filled 2011-08-16: qty 5

## 2011-08-16 MED ORDER — ACETAMINOPHEN 650 MG RE SUPP: 650.0000 mg | Freq: Four times a day (QID) | RECTAL | Status: DC | PRN

## 2011-08-16 MED ORDER — POLYETHYLENE GLYCOL 3350 17 G PO PACK
17.0000 g | PACK | Freq: Every day | ORAL | Status: DC | PRN
  Filled 2011-08-16: qty 1

## 2011-08-16 MED ORDER — ACETAMINOPHEN 325 MG PO TABS: 650.0000 mg | ORAL_TABLET | Freq: Four times a day (QID) | ORAL | Status: DC | PRN

## 2011-08-16 MED ORDER — FENTANYL CITRATE 0.05 MG/ML IJ SOLN
50.0000 ug | Freq: Once | INTRAMUSCULAR | Status: AC
  Administered 2011-08-16: 50 ug via INTRAVENOUS
  Filled 2011-08-16: qty 2

## 2011-08-16 MED ORDER — SENNA 8.6 MG PO TABS
1.0000 | ORAL_TABLET | Freq: Two times a day (BID) | ORAL | Status: DC
  Administered 2011-08-16 – 2011-08-19 (×6): 8.6 mg via ORAL
  Filled 2011-08-16 (×7): qty 1

## 2011-08-16 MED ORDER — MORPHINE SULFATE 2 MG/ML IJ SOLN: 2.0000 mg | INTRAMUSCULAR | Status: DC | PRN

## 2011-08-16 MED ORDER — SODIUM CHLORIDE 0.9 % IJ SOLN
3.0000 mL | Freq: Two times a day (BID) | INTRAMUSCULAR | Status: DC
  Administered 2011-08-17 – 2011-08-18 (×4): 3 mL via INTRAVENOUS

## 2011-08-16 MED ORDER — HYDRALAZINE HCL 20 MG/ML IJ SOLN
10.0000 mg | INTRAMUSCULAR | Status: DC | PRN
  Filled 2011-08-16: qty 0.5

## 2011-08-16 MED ORDER — ONDANSETRON HCL 4 MG PO TABS: 4.0000 mg | ORAL_TABLET | Freq: Four times a day (QID) | ORAL | Status: DC | PRN

## 2011-08-16 MED ORDER — GUAIFENESIN-DM 100-10 MG/5ML PO SYRP
5.0000 mL | ORAL_SOLUTION | ORAL | Status: DC | PRN
  Filled 2011-08-16: qty 5

## 2011-08-16 MED ORDER — AMLODIPINE BESYLATE 10 MG PO TABS
10.0000 mg | ORAL_TABLET | Freq: Every day | ORAL | Status: DC
  Administered 2011-08-16 – 2011-08-19 (×4): 10 mg via ORAL
  Filled 2011-08-16 (×4): qty 1

## 2011-08-16 MED ORDER — ONDANSETRON HCL 4 MG/2ML IJ SOLN: 4.0000 mg | Freq: Four times a day (QID) | INTRAMUSCULAR | Status: DC | PRN

## 2011-08-16 MED ORDER — DOCUSATE SODIUM 100 MG PO CAPS
100.0000 mg | ORAL_CAPSULE | Freq: Two times a day (BID) | ORAL | Status: DC
  Administered 2011-08-16 – 2011-08-19 (×6): 100 mg via ORAL
  Filled 2011-08-16 (×7): qty 1

## 2011-08-16 MED ORDER — SODIUM CHLORIDE 0.9 % IJ SOLN: 3.0000 mL | INTRAMUSCULAR | Status: DC | PRN

## 2011-08-16 MED ORDER — TRAMADOL HCL 50 MG PO TABS
50.0000 mg | ORAL_TABLET | Freq: Four times a day (QID) | ORAL | Status: DC | PRN
  Filled 2011-08-16: qty 1

## 2011-08-16 MED ORDER — POLYETHYL GLYCOL-PROPYL GLYCOL 0.4-0.3 % OP SOLN: 1.0000 [drp] | Freq: Three times a day (TID) | OPHTHALMIC | Status: DC

## 2011-08-16 MED ORDER — POLYVINYL ALCOHOL 1.4 % OP SOLN
1.0000 [drp] | Freq: Three times a day (TID) | OPHTHALMIC | Status: DC
  Administered 2011-08-16 – 2011-08-19 (×5): 1 [drp] via OPHTHALMIC
  Filled 2011-08-16: qty 15

## 2011-08-16 MED ORDER — ACETAMINOPHEN 500 MG PO TABS
1000.0000 mg | ORAL_TABLET | Freq: Four times a day (QID) | ORAL | Status: DC | PRN
  Filled 2011-08-16: qty 2

## 2011-08-16 MED ORDER — POTASSIUM CHLORIDE CRYS ER 20 MEQ PO TBCR
40.0000 meq | EXTENDED_RELEASE_TABLET | Freq: Once | ORAL | Status: DC
  Filled 2011-08-16: qty 2

## 2011-08-16 MED ORDER — ALBUTEROL SULFATE (5 MG/ML) 0.5% IN NEBU: 2.5000 mg | INHALATION_SOLUTION | RESPIRATORY_TRACT | Status: DC | PRN

## 2011-08-16 MED ORDER — HYDROCODONE-ACETAMINOPHEN 5-325 MG PO TABS
1.0000 | ORAL_TABLET | ORAL | Status: DC | PRN
  Administered 2011-08-16: 1 via ORAL
  Administered 2011-08-17 – 2011-08-18 (×7): 2 via ORAL
  Administered 2011-08-19: 1 via ORAL
  Administered 2011-08-19: 2 via ORAL
  Filled 2011-08-16 (×4): qty 2
  Filled 2011-08-16: qty 1
  Filled 2011-08-16 (×5): qty 2

## 2011-08-16 MED ORDER — LEVOTHYROXINE SODIUM 88 MCG PO TABS
88.0000 ug | ORAL_TABLET | Freq: Every day | ORAL | Status: DC
  Administered 2011-08-17 – 2011-08-19 (×4): 88 ug via ORAL
  Filled 2011-08-16 (×5): qty 1

## 2011-08-16 MED ORDER — SODIUM CHLORIDE 0.9 % IV SOLN: 250.0000 mL | INTRAVENOUS | Status: DC | PRN

## 2011-08-16 NOTE — ED Notes (Signed)
Reports being seen here 2 days ago for fall, was sent here today due to uncontrolled pain and urinary retention. Was told that she was going to be admitted, bed control doesn't have been for her at this time.

## 2011-08-16 NOTE — H&P (Signed)
PCP:  Dr. Chari Manning   Chief Complaint:   Back pain and urinary incontinence.   HPI: [name] is a 78 y.o. female   has a past medical history of Hypertension; Thyroid disease; and Hypothyroid.   Presented with  Fall 2 wks ago at first she tolearted it well but then developed pain in legs bilateraly and what sounds like overflow urinary incontinence due to retantion.  States when she used to sit on the toilet she was unable to void but once she would stand up urine would leak spontaneously. She had an MRI done that showed sacral fracture but no chord compression. Patient states she is in significant pain with ambulation. She lives alone. She still have some urinary retantion but that seems to be imroving. Pain partially controlled with PO meds. Patient was seen for the same in ED on the 2/15 and was send home with percocet. Unfortunately she still has trouble ambulating and her PCP send her in to be admitted for what sounds to be pain management. Patient would likely benefit from placement.   Review of Systems:    Pertinent positives include: back pain, mild dysuria,   Constitutional:  No weight loss, night sweats, Fevers, chills, fatigue.  HEENT:  No headaches, Difficulty swallowing,Tooth/dental problems,Sore throat,  No sneezing, itching, ear ache, nasal congestion, post nasal drip,  Cardio-vascular:  No chest pain, Orthopnea, PND, anasarca, dizziness, palpitations.no Bilateral lower extremity swelling  GI:  No heartburn, indigestion, abdominal pain, nausea, vomiting, diarrhea, change in bowel habits, loss of appetite, melena, blood in stool, hematoemesis Resp:  no shortness of breath at rest. No dyspnea on exertion, No excess mucus, no productive cough, No non-productive cough, No coughing up of blood.No change in color of mucus.No wheezing.No chest wall deformity  Skin:  no rash or lesions.  GU:  change in color of urine, no urgency or frequency. No flank pain.    Musculoskeletal:  No joint pain or swelling. No decreased range of motion. No back pain.  Psych:  No change in mood or affect. No depression or anxiety. No memory loss.  Neuro: no localizing neurological complaints, no tingling, no weakness, no double vision, no gait abnormality, no slurred speech   Otherwise ROS are negative except for above, 10 systems were reviewed  Past Medical History: Past Medical History  Diagnosis Date  . Hypertension   . Thyroid disease   . Hypothyroid    Past Surgical History  Procedure Date  . Brain surgery      Medications: Prior to Admission medications   Medication Sig Start Date End Date Taking? Authorizing Provider  acetaminophen (TYLENOL) 500 MG tablet Take 1,000 mg by mouth every 6 (six) hours as needed. For pain   Yes Historical Provider, MD  alendronate (FOSAMAX) 70 MG tablet Take 70 mg by mouth every 7 (seven) days. Take with a full glass of water on an empty stomach. Takes on Sunday.    Yes Historical Provider, MD  amLODipine (NORVASC) 10 MG tablet Take 10 mg by mouth daily.     Yes Historical Provider, MD  bimatoprost (LUMIGAN) 0.01 % SOLN Place 1 drop into both eyes at bedtime.   Yes Historical Provider, MD  Calcium 1200-1000 MG-UNIT CHEW Chew 1 tablet by mouth daily.     Yes Historical Provider, MD  cholecalciferol (VITAMIN D) 1000 UNITS tablet Take 1,000 Units by mouth daily.     Yes Historical Provider, MD  levothyroxine (SYNTHROID, LEVOTHROID) 88 MCG tablet Take 88 mcg by  mouth daily.     Yes Historical Provider, MD  Multiple Vitamins-Minerals (ICAPS PO) Take 1 tablet by mouth daily.     Yes Historical Provider, MD  oxyCODONE-acetaminophen (PERCOCET) 5-325 MG per tablet Take 2 tablets by mouth every 4 (four) hours as needed for pain. 08/14/11 08/24/11 Yes Nicholes Stairs, MD  pantoprazole (PROTONIX) 40 MG tablet Take 40 mg by mouth 2 (two) times daily before a meal. 05/25/11 05/24/12 Yes Leroy Sea, MD  Polyethyl Glycol-Propyl  Glycol (SYSTANE OP) Place 1 drop into both eyes 3 (three) times daily.     Yes Historical Provider, MD  tolterodine (DETROL) 2 MG tablet Take 2 mg by mouth daily.    Yes Historical Provider, MD    Allergies:  No Known Allergies  Social History:  Ambulatory with cain but recently in too much pain.  Lives at home alone   reports that she has never smoked. She does not have any smokeless tobacco history on file. She reports that she does not drink alcohol or use illicit drugs.   Family History: family history is not on file.    Physical Exam: Patient Vitals for the past 24 hrs:  BP Temp Temp src Pulse Resp SpO2  08/16/11 1914 159/70 mmHg 98 F (36.7 C) Oral 98  18  99 %  08/16/11 1806 151/61 mmHg - - 75  - 100 %  08/16/11 1633 152/71 mmHg - - - - -  08/16/11 1632 - - - 73  - 96 %  08/16/11 1449 138/66 mmHg 97.8 F (36.6 C) Oral 76  18  95 %    1. General:  in No Acute distress 2. Psychological: Alert and Oriented 3. Head/ENT:   Moist Mucous Membranes                          Head Non traumatic, neck supple                          Normal     Dentition 4. SKIN: normal  Skin turgor,  Skin clean Dry and intact no rash 5. Heart: Regular rate and rhythm systolic Murmur, Rub or gallop 6. Lungs: Clear to auscultation bilaterally, no wheezes or crackles   7. Abdomen: Soft, non-tender, Non distended 8. Lower extremities: no clubbing, cyanosis, or edema 9. Neurologically Grossly intact, moving all 4 extremities equally, strength 5/5 in all 4 ext, CN 2-12 intact 10. MSK: Normal range of motion  body mass index is unknown because there is no height or weight on file.   Labs on Admission:   [facility] 08/16/11 1554 08/14/11 1622  NA 135 135  K 3.8 3.3*  CL 100 102  CO2 23 24  GLUCOSE 88 117*  BUN 20 14  CREATININE 0.94 0.83  CALCIUM 9.3 9.3  MG -- --  PHOS -- --   No results found for this basename: AST:2,ALT:2,ALKPHOS:2,BILITOT:2,PROT:2,ALBUMIN:2 in the last 72 hours No  results found for this basename: LIPASE:2,AMYLASE:2 in the last 72 hours  Basename 08/16/11 1554 08/14/11 1622  WBC 8.8 7.6  NEUTROABS -- 5.6  HGB 13.1 12.4  HCT 39.3 38.2  MCV 92.0 92.3  PLT 347 351   No results found for this basename: CKTOTAL:3,CKMB:3,CKMBINDEX:3,TROPONINI:3 in the last 72 hours No results found for this basename: TSH,T4TOTAL,FREET3,T3FREE,THYROIDAB in the last 72 hours No results found for this basename: VITAMINB12:2,FOLATE:2,FERRITIN:2,TIBC:2,IRON:2,RETICCTPCT:2 in the last 72 hours No results found for  this basename: HGBA1C    The CrCl is unknown because both a height and weight (above a minimum accepted value) are required for this calculation. ABG No results found for this basename: phart, pco2, po2, hco3, tco2, acidbasedef, o2sat     No results found for this basename: DDIMER     Other results:   UA no evidence of infection   Cultures:    Component Value Date/Time   SDES URINE, RANDOM 05/25/2011 1130   SPECREQUEST NONE 05/25/2011 1130   CULT NO GROWTH 05/25/2011 1130   REPTSTATUS 05/26/2011 FINAL 05/25/2011 1130       Radiological Exams on Admission: 08/13/2011 *RADIOLOGY REPORT* Clinical Data: Status post fall 2 weeks ago with severe low back pain to the coccyx. Worsened incontinence. MRI LUMBAR SPINE WITHOUT CONTRAST Technique: Multiplanar and multiecho pulse sequences of the lumbar spine were obtained without intravenous contrast. Comparison: Plain films of the lumbar spine and sacrum 08/10/2011. Findings: Patient motion somewhat degrades the study. There is partial visualization of a fracture of the sacrum seen in the S2 and S3 segments. Visualized portion of the fracture is nondisplaced. No other fracture is identified. The patient has 0.5 cm of anterolisthesis of L4 on L5 due to facet arthropathy. Alignment is otherwise unremarkable. Convex left scoliosis is noted. The conus medullaris is normal signal and position. Imaged intra-abdominal  contents demonstrate marked distention of the urinary bladder. The T10-11, T11-12 and T12-L1 levels imaged in the sagittal plane only and negative. L1-2: Negative. L2-3: Facet degenerative disease and mild disc bulge without central canal or foraminal stenosis. L3-4: Disc bulge and facet arthropathy identified. Central canal is mildly to moderately narrowed. Foramina appear open. L4-5: The disc is uncovered and bulging. Ligamentum flavum thickening and facet arthropathy noted. There is severe central canal and lateral recess stenosis. The foramina appear open. L5-S1: Disc bulge with some facet degenerative disease. Central canal and foramina appear open. IMPRESSION: 1. Partial visualization of an acute/subacute sacral fracture. Visualized portion of the fracture appears nondisplaced. 2. Marked distention of the urinary bladder. Cause of distention is not discretely visualized. Cause for distention is not identified. 3. Degenerative disease appearing worst at L4-5 where there is severe central canal and lateral recess stenosis. Original Report Authenticated By: Bernadene Bell. Maricela Curet, M.D.       Assessment/Plan 78 yo F w severe back pain due to sacral fracture and urinary retention here for pain control and possible palcement   Present on Admission:   .Back pain - will admit, treat pain with robaxin prn  Morphine and tramadol .Sacral fracture, closed - would consider NS consult in AM to see what suggestions they have for management .Hypertension - cont home meds, prn hydralazine Urinary retention - per patient this started to improve. Continue foley for now and would get urology opinion. Stop Detrol Prophylaxis: SCD protonix CODE STATUS: DNR/DNI   Areil Ottey 08/16/2011, 7:20 PM

## 2011-08-16 NOTE — ED Provider Notes (Signed)
History     CSN: [id number]  Arrival date & time 08/16/11  1437   First MD Initiated Contact with Patient 08/16/11 1528      Chief Complaint  Patient presents with  . Fall  . Urinary Retention    (Consider location/radiation/quality/duration/timing/severity/associated sxs/prior treatment) HPI Comments: Female presenting from home with known sacral fracture after a fall about 2 weeks ago.  ED 2 days ago for back pain and urinary retention and had a negative workup. She did have an MRI on February 14th which did not show any cord compression. She returns today with worsening pain difficulty with ambulation, anorexia and declined functional status. Discussed with her PCP Dr. Uvaldo Rising who recommended she come in for admission. Eyes any new falls. She taking oxycodone at home with some relief. Difficulty urinating is going small amounts each time. She denies any loss of bowel control. She denies any fevers, nausea or vomiting. She denies any focal weakness, numbness, tingling.  The history is provided by the patient and a relative.    Past Medical History  Diagnosis Date  . Hypertension   . Thyroid disease   . Hypothyroid     Past Surgical History  Procedure Date  . Brain surgery     History reviewed. No pertinent family history.  History  Substance Use Topics  . Smoking status: Never Smoker   . Smokeless tobacco: Not on file  . Alcohol Use: No    OB History    Grav Para Term Preterm Abortions TAB SAB Ect Mult Living                  Review of Systems  Constitutional: Positive for activity change. Negative for fever and fatigue.  HENT: Negative for congestion, rhinorrhea, neck pain and neck stiffness.   Respiratory: Negative for cough, chest tightness and shortness of breath.   Cardiovascular: Negative for chest pain.  Gastrointestinal: Negative for nausea, vomiting and abdominal pain.  Genitourinary: Positive for decreased urine volume. Negative for dysuria.    Musculoskeletal: Positive for myalgias, back pain and arthralgias.  Skin: Negative for rash.  Neurological: Positive for weakness. Negative for dizziness and headaches.    Allergies  Review of patient's allergies indicates no known allergies.  Home Medications   Current Outpatient Rx  Name Route Sig Dispense Refill  . ACETAMINOPHEN 500 MG PO TABS Oral Take 1,000 mg by mouth every 6 (six) hours as needed. For pain    . ALENDRONATE SODIUM 70 MG PO TABS Oral Take 70 mg by mouth every 7 (seven) days. Take with a full glass of water on an empty stomach. Takes on Sunday.     Marland Kitchen AMLODIPINE BESYLATE 10 MG PO TABS Oral Take 10 mg by mouth daily.      Marland Kitchen BIMATOPROST 0.01 % OP SOLN Both Eyes Place 1 drop into both eyes at bedtime.    Marland Kitchen CALCIUM 1200-1000 MG-UNIT PO CHEW Oral Chew 1 tablet by mouth daily.      Marland Kitchen VITAMIN D 1000 UNITS PO TABS Oral Take 1,000 Units by mouth daily.      Marland Kitchen LEVOTHYROXINE SODIUM 88 MCG PO TABS Oral Take 88 mcg by mouth daily.      . ICAPS PO Oral Take 1 tablet by mouth daily.      . OXYCODONE-ACETAMINOPHEN 5-325 MG PO TABS Oral Take 2 tablets by mouth every 4 (four) hours as needed for pain. 15 tablet 0  . PANTOPRAZOLE SODIUM 40 MG PO TBEC Oral Take 40  mg by mouth 2 (two) times daily before a meal.    . SYSTANE OP Both Eyes Place 1 drop into both eyes 3 (three) times daily.      . TOLTERODINE TARTRATE 2 MG PO TABS Oral Take 2 mg by mouth daily.       BP 152/71  Pulse 73  Temp(Src) 97.8 F (36.6 C) (Oral)  Resp 18  SpO2 96%  Physical Exam  Constitutional: She is oriented to person, place, and time. She appears well-developed and well-nourished. No distress.  HENT:  Head: Normocephalic and atraumatic.  Mouth/Throat: Oropharynx is clear and moist. No oropharyngeal exudate.  Eyes: Conjunctivae are normal. Pupils are equal, round, and reactive to light.  Neck: Normal range of motion. Neck supple.  Cardiovascular: Normal rate, regular rhythm and normal heart sounds.    Pulmonary/Chest: Effort normal and breath sounds normal. No respiratory distress.  Abdominal: Soft. There is no tenderness. There is no rebound and no guarding.  Genitourinary:       Normal rectal tone  Musculoskeletal: She exhibits tenderness.       Diffuse tenderness across sacrum and low back  Neurological: She is alert and oriented to person, place, and time. No cranial nerve deficit.       5/5 strength in bilateral lower extremities.  +2 patellar reflexes bilaterally  Skin: Skin is warm.    ED Course  Procedures (including critical care time)  Labs Reviewed  BASIC METABOLIC PANEL - Abnormal; Notable for the following:    GFR calc non Af Amer 51 (*)    GFR calc Af Amer 60 (*)    All other components within normal limits  URINALYSIS, ROUTINE W REFLEX MICROSCOPIC - Abnormal; Notable for the following:    Ketones, ur 15 (*)    All other components within normal limits  CBC  URINE CULTURE   No results found.   1. Sacral fracture   2. Urinary retention       MDM  Low back pain, difficulty ambulating, known sacral fracture.  Also with urinary retention without evidence of cord compression on recent MRI.  No neurological deficit.  Foley catheter placed. Given inability to ambulate and persistent pain, will admit for further evaluation.       Glynn Octave, MD 08/16/11 [phone]

## 2011-08-17 DIAGNOSIS — I10 Essential (primary) hypertension: Secondary | ICD-10-CM | POA: Diagnosis not present

## 2011-08-17 DIAGNOSIS — M545 Low back pain: Secondary | ICD-10-CM | POA: Diagnosis not present

## 2011-08-17 DIAGNOSIS — R339 Retention of urine, unspecified: Secondary | ICD-10-CM | POA: Diagnosis not present

## 2011-08-17 DIAGNOSIS — IMO0002 Reserved for concepts with insufficient information to code with codable children: Secondary | ICD-10-CM | POA: Diagnosis not present

## 2011-08-17 LAB — CBC
MCV: 92.5 fL (ref 78.0–100.0)
Platelets: 327 10*3/uL (ref 150–400)
RDW: 13.7 % (ref 11.5–15.5)
WBC: 8.2 10*3/uL (ref 4.0–10.5)

## 2011-08-17 LAB — URINE CULTURE
Colony Count: NO GROWTH
Culture  Setup Time: 201302172133
Culture: NO GROWTH

## 2011-08-17 LAB — COMPREHENSIVE METABOLIC PANEL
Albumin: 2.8 g/dL — ABNORMAL LOW (ref 3.5–5.2)
Alkaline Phosphatase: 137 U/L — ABNORMAL HIGH (ref 39–117)
BUN: 16 mg/dL (ref 6–23)
CO2: 23 mEq/L (ref 19–32)
Chloride: 106 mEq/L (ref 96–112)
GFR calc non Af Amer: 71 mL/min — ABNORMAL LOW (ref >90)
Glucose, Bld: 103 mg/dL — ABNORMAL HIGH (ref 70–99)
Potassium: 3.6 mEq/L (ref 3.5–5.1)
Total Bilirubin: 0.5 mg/dL (ref 0.3–1.2)

## 2011-08-17 MED ORDER — POLYETHYLENE GLYCOL 3350 17 G PO PACK
17.0000 g | PACK | Freq: Every day | ORAL | Status: DC
  Administered 2011-08-18 – 2011-08-19 (×3): 17 g via ORAL
  Filled 2011-08-17 (×3): qty 1

## 2011-08-17 MED ORDER — ENOXAPARIN SODIUM 40 MG/0.4ML ~~LOC~~ SOLN
40.0000 mg | Freq: Every day | SUBCUTANEOUS | Status: DC
  Administered 2011-08-17: 40 mg via SUBCUTANEOUS
  Filled 2011-08-17 (×2): qty 0.4

## 2011-08-17 NOTE — Plan of Care (Signed)
Problem: Phase I Progression Outcomes Goal: Initial discharge plan identified Outcome: Completed/Met Date Met:  08/17/11 Pt. Plans to return home with some assistance, for PT/OT eval Goal: Voiding-avoid urinary catheter unless indicated Outcome: Not Met (add Reason) Having urinary retention

## 2011-08-17 NOTE — Progress Notes (Signed)
08/17/2011 [facility], Bosie Clos SPARKS Case Management Note [phone]    CARE MANAGEMENT NOTE 08/17/2011  Patient:  [name], [name]   Account Number:  [id number]  Date Initiated:  08/17/2011  Documentation initiated by:  [facility]  Subjective/Objective Assessment:   Admitted with back pain, sacral fx.     Action/Plan:   Anticipated DC Date:     Anticipated DC Plan:        DC Planning Services  CM consult      Choice offered to / List presented to:             Status of service:  In process, will continue to follow Medicare Important Message given?   (If response is "NO", the following Medicare IM given date fields will be blank) Date Medicare IM given:   Date Additional Medicare IM given:    Discharge Disposition:    Per UR Regulation:  Reviewed for med. necessity/level of care/duration of stay  Comments:   08/17/11 Spoke with patient and her friend Darden Dates and explianed  code 44. Form signed by Ms Deborah Chalk per patient's request, copy given to patient. Jacquelynn Cree RN,BSN,CCM  08/17/11-1037-J.Lutricia Horsfall  [phone]      78yo female patient admitted with sacral fracture. Prior to admission, patient lived at home with support of friends and sister. Independent with ADLs. In to speak with patient. Requested a list of private duty agencies, given. No needs noted at present. CM will continue to monitor.

## 2011-08-17 NOTE — Progress Notes (Signed)
Utilization review completed. [name] 08/17/2011 

## 2011-08-17 NOTE — Progress Notes (Signed)
PATIENT DETAILS Name: [name] Age: 78 y.o. Sex: female Date of Birth: [birth year] Admit Date: 08/16/2011 PCP:No primary provider on file.  Subjective: Continues to have back pain-however controlled by narcotics  Objective: Vital signs in last 24 hours: Filed Vitals:   08/17/11 0559 08/17/11 1018 08/17/11 1107 08/17/11 1305  BP: 111/59 110/50 111/59 135/61  Pulse: 65  69 77  Temp: 98.4 F (36.9 C)  98.1 F (36.7 C) 98.6 F (37 C)  TempSrc: Oral  Oral Oral  Resp: 18  18 18   Height:      Weight:      SpO2: 94%  95% 93%    Weight change:   Body mass index is 22.35 kg/(m^2).  Intake/Output from previous day:  Intake/Output Summary (Last 24 hours) at 08/17/11 1651 Last data filed at 08/17/11 1306  Gross per 24 hour  Intake    803 ml  Output   1400 ml  Net   -597 ml    PHYSICAL EXAM: Gen Exam: Awake and alert with clear speech.   Neck: Supple, No JVD.   Chest: B/L Clear.   CVS: S1 S2 Regular, no murmurs. Abdomen: soft, BS +, non tender, non distended.  Extremities: no edema, lower extremities warm to touch. Neurologic: Non Focal.   Skin: No Rash.   Wounds: N/A.    CONSULTS:  None  LAB RESULTS: CBC  Lab 08/17/11 0515 08/16/11 1554 08/14/11 1622  WBC 8.2 8.8 7.6  HGB 11.0* 13.1 12.4  HCT 34.3* 39.3 38.2  PLT 327 347 351  MCV 92.5 92.0 92.3  MCH 29.6 30.7 30.0  MCHC 32.1 33.3 32.5  RDW 13.7 13.7 13.5  LYMPHSABS -- -- 1.4  MONOABS -- -- 0.4  EOSABS -- -- 0.2  BASOSABS -- -- 0.0  BANDABS -- -- --    Chemistries   Lab 08/17/11 0515 08/16/11 1554 08/14/11 1622  NA 138 135 135  K 3.6 3.8 3.3*  CL 106 100 102  CO2 23 23 24   GLUCOSE 103* 88 117*  BUN 16 20 14   CREATININE 0.78 0.94 0.83  CALCIUM 8.8 9.3 9.3  MG 2.0 -- --    GFR Estimated Creatinine Clearance: 42.9 ml/min (by C-G formula based on Cr of 0.78).  Coagulation profile No results found for this basename: INR:5,PROTIME:5 in the last 168 hours  Cardiac Enzymes No results  found for this basename: CK:3,CKMB:3,TROPONINI:3,MYOGLOBIN:3 in the last 168 hours  No components found with this basename: POCBNP:3 No results found for this basename: DDIMER:2 in the last 72 hours No results found for this basename: HGBA1C:2 in the last 72 hours No results found for this basename: CHOL:2,HDL:2,LDLCALC:2,TRIG:2,CHOLHDL:2,LDLDIRECT:2 in the last 72 hours  Basename 08/17/11 0515  TSH 12.109*  T4TOTAL --  T3FREE --  THYROIDAB --   No results found for this basename: VITAMINB12:2,FOLATE:2,FERRITIN:2,TIBC:2,IRON:2,RETICCTPCT:2 in the last 72 hours No results found for this basename: LIPASE:2,AMYLASE:2 in the last 72 hours  Urine Studies No results found for this basename: UACOL:2,UAPR:2,USPG:2,UPH:2,UTP:2,UGL:2,UKET:2,UBIL:2,UHGB:2,UNIT:2,UROB:2,ULEU:2,UEPI:2,UWBC:2,URBC:2,UBAC:2,CAST:2,CRYS:2,UCOM:2,BILUA:2 in the last 72 hours  MICROBIOLOGY: No results found for this or any previous visit (from the past 240 hour(s)).  RADIOLOGY STUDIES/RESULTS: Mr Lumbar Spine Wo Contrast  08/13/2011  *RADIOLOGY REPORT*  Clinical Data: Status post fall 2 weeks ago with severe low back pain to the coccyx.  Worsened incontinence.  MRI LUMBAR SPINE WITHOUT CONTRAST  Technique:  Multiplanar and multiecho pulse sequences of the lumbar spine were obtained without intravenous contrast.  Comparison: Plain films of the lumbar spine and sacrum  08/10/2011.  Findings: Patient motion somewhat degrades the study.  There is partial visualization of a fracture of the sacrum seen in the S2 and S3 segments. Visualized portion of the fracture is nondisplaced.  No other fracture is identified. The patient has 0.5 cm of anterolisthesis of L4 on L5 due to facet arthropathy. Alignment is otherwise unremarkable. Convex left scoliosis is noted.  The conus medullaris is normal signal and position.  Imaged intra-abdominal contents demonstrate marked distention of the urinary bladder.  The T10-11, T11-12 and T12-L1 levels  imaged in the sagittal plane only and negative.  L1-2:  Negative.  L2-3:  Facet degenerative disease and mild disc bulge without central canal or foraminal stenosis.  L3-4:  Disc bulge and facet arthropathy identified.  Central canal is mildly to moderately narrowed.  Foramina appear open.  L4-5:  The disc is uncovered and bulging.  Ligamentum flavum thickening and facet arthropathy noted.  There is severe central canal and lateral recess stenosis.  The foramina appear open.  L5-S1:  Disc bulge with some facet degenerative disease.  Central canal and foramina appear open.  IMPRESSION: 1.  Partial visualization of an acute/subacute sacral fracture. Visualized portion of the fracture appears nondisplaced. 2.  Marked distention of the urinary bladder.  Cause of distention is not discretely visualized.  Cause for distention is not identified. 3.  Degenerative disease appearing worst at L4-5 where there is severe central canal and lateral recess stenosis.  Original Report Authenticated By: Bernadene Bell. D'ALESSIO, M.D.    MEDICATIONS: Scheduled Meds:   . amLODipine  10 mg Oral Daily  . bimatoprost  1 drop Both Eyes QHS  . docusate sodium  100 mg Oral BID  . levothyroxine  88 mcg Oral Daily  . pantoprazole  40 mg Oral BID AC  . polyvinyl alcohol  1 drop Both Eyes TID  . senna  1 tablet Oral BID  . sodium chloride  3 mL Intravenous Q12H  . DISCONTD: Polyethyl Glycol-Propyl Glycol  1 drop Both Eyes TID  . DISCONTD: potassium chloride  40 mEq Oral Once   Continuous Infusions:  PRN Meds:.sodium chloride, acetaminophen, acetaminophen, acetaminophen, albuterol, alum & mag hydroxide-simeth, guaiFENesin-dextromethorphan, hydrALAZINE, HYDROcodone-acetaminophen, methocarbamol(ROBAXIN) IV, morphine, ondansetron (ZOFRAN) IV, ondansetron, polyethylene glycol, sodium chloride, traMADol  Antibiotics: Anti-infectives    None      Assessment/Plan: Patient Active Hospital Problem List: Low Back pain   -intractable -secondary to sacral fractures -d/w Dr Jenkins-NeuroSurgery on call-radiology studies and clinical features discussed, he did not advise any surgical options, advised to consider Sacroplasty, since pain in intractable, will ask IR to evaluated for kyphoplasty/Sacroplasty -continue with as needed Norco -add Miralax and continue with Senokot  Urinary Retention with ? Overflow incontinence -? 2/2 to pelvic pain/low back pain -spoke with Dr Annabell Howells, case discussed, he advises to keep foley in place, and have patient follow up with him in 2 weeks or so for a possible voiding trial.  Hypertension  -controlled -continue with amlodipine  Hypothyroidism -continue with Levothyroxine    Disposition: Remain inpatient-get PT eval  DVT Prophylaxis: Prophylactic Lovenox   Code Status: DNR  Maretta Bees,  MD. 08/17/2011, 4:51 PM

## 2011-08-18 ENCOUNTER — Encounter (HOSPITAL_COMMUNITY): Payer: Self-pay | Admitting: Radiology

## 2011-08-18 DIAGNOSIS — I10 Essential (primary) hypertension: Secondary | ICD-10-CM | POA: Diagnosis not present

## 2011-08-18 DIAGNOSIS — M545 Low back pain: Secondary | ICD-10-CM | POA: Diagnosis not present

## 2011-08-18 DIAGNOSIS — M8448XA Pathological fracture, other site, initial encounter for fracture: Secondary | ICD-10-CM | POA: Diagnosis not present

## 2011-08-18 DIAGNOSIS — R339 Retention of urine, unspecified: Secondary | ICD-10-CM | POA: Diagnosis not present

## 2011-08-18 DIAGNOSIS — IMO0002 Reserved for concepts with insufficient information to code with codable children: Secondary | ICD-10-CM | POA: Diagnosis not present

## 2011-08-18 MED ORDER — ENOXAPARIN SODIUM 40 MG/0.4ML ~~LOC~~ SOLN
40.0000 mg | Freq: Every day | SUBCUTANEOUS | Status: DC
  Filled 2011-08-18 (×2): qty 0.4

## 2011-08-18 MED ORDER — CEFAZOLIN SODIUM 1-5 GM-% IV SOLN
1.0000 g | Freq: Once | INTRAVENOUS | Status: DC
  Filled 2011-08-18: qty 50

## 2011-08-18 NOTE — H&P (Signed)
[name] is an 78 y.o. female.   Chief Complaint: Larey Seat at home approx 1 week ago Low back pain; radiates to R buttock and leg; MRI shows sacral fracture HPI: fracture is amenable to Vertebroplasty S1 per Dr Corliss Skains Scheduled for Sacroplasty 2/20  Past Medical History  Diagnosis Date  . Hypertension   . Thyroid disease   . Hypothyroid     Past Surgical History  Procedure Date  . Brain surgery     Family History  Problem Relation Age of Onset  . Heart disease Mother    Social History:  reports that she has never smoked. She does not have any smokeless tobacco history on file. She reports that she does not drink alcohol or use illicit drugs.  Allergies: No Known Allergies  Medications Prior to Admission  Medication Dose Route Frequency Provider Last Rate Last Dose  . 0.9 %  sodium chloride infusion  250 mL Intravenous PRN Therisa Doyne, MD      . acetaminophen (TYLENOL) tablet 650 mg  650 mg Oral Q6H PRN Therisa Doyne, MD       Or  . acetaminophen (TYLENOL) suppository 650 mg  650 mg Rectal Q6H PRN Therisa Doyne, MD      . acetaminophen (TYLENOL) tablet 1,000 mg  1,000 mg Oral Q6H PRN Therisa Doyne, MD      . albuterol (PROVENTIL) (5 MG/ML) 0.5% nebulizer solution 2.5 mg  2.5 mg Nebulization Q2H PRN Therisa Doyne, MD      . alum & mag hydroxide-simeth (MAALOX/MYLANTA) 200-200-20 MG/5ML suspension 30 mL  30 mL Oral Q6H PRN Therisa Doyne, MD      . amLODipine (NORVASC) tablet 10 mg  10 mg Oral Daily Therisa Doyne, MD   10 mg at 08/18/11 0920  . bimatoprost (LUMIGAN) 0.01 % ophthalmic solution 1 drop  1 drop Both Eyes QHS Therisa Doyne, MD   1 drop at 08/16/11 2245  . docusate sodium (COLACE) capsule 100 mg  100 mg Oral BID Therisa Doyne, MD   100 mg at 08/18/11 0920  . enoxaparin (LOVENOX) injection 40 mg  40 mg Subcutaneous q1800 Jeoffrey Massed, MD   40 mg at 08/17/11 1809  . fentaNYL (SUBLIMAZE) injection 50 mcg  50 mcg  Intravenous Once Glynn Octave, MD   50 mcg at 08/16/11 1629  . guaiFENesin-dextromethorphan (ROBITUSSIN DM) 100-10 MG/5ML syrup 5 mL  5 mL Oral Q4H PRN Therisa Doyne, MD      . hydrALAZINE (APRESOLINE) injection 10 mg  10 mg Intravenous Q4H PRN Therisa Doyne, MD      . HYDROcodone-acetaminophen (NORCO) 5-325 MG per tablet 1-2 tablet  1-2 tablet Oral Q4H PRN Therisa Doyne, MD   2 tablet at 08/18/11 0757  . levothyroxine (SYNTHROID, LEVOTHROID) tablet 88 mcg  88 mcg Oral Daily Therisa Doyne, MD   88 mcg at 08/18/11 0920  . methocarbamol (ROBAXIN) 500 mg in dextrose 5 % 50 mL IVPB  500 mg Intravenous Q8H PRN Therisa Doyne, MD      . morphine 2 MG/ML injection 2 mg  2 mg Intravenous Q4H PRN Therisa Doyne, MD      . ondansetron (ZOFRAN) tablet 4 mg  4 mg Oral Q6H PRN Therisa Doyne, MD       Or  . ondansetron (ZOFRAN) injection 4 mg  4 mg Intravenous Q6H PRN Therisa Doyne, MD      . pantoprazole (PROTONIX) EC tablet 40 mg  40 mg Oral BID AC Therisa Doyne, MD   40  mg at 08/18/11 0922  . polyethylene glycol (MIRALAX / GLYCOLAX) packet 17 g  17 g Oral Daily Jeoffrey Massed, MD   17 g at 08/18/11 0921  . polyvinyl alcohol (LIQUIFILM TEARS) 1.4 % ophthalmic solution 1 drop  1 drop Both Eyes TID Jeoffrey Massed, MD   1 drop at 08/18/11 0921  . senna (SENOKOT) tablet 8.6 mg  1 tablet Oral BID Therisa Doyne, MD   8.6 mg at 08/18/11 0920  . sodium chloride 0.9 % injection 3 mL  3 mL Intravenous Q12H Therisa Doyne, MD   3 mL at 08/18/11 0927  . sodium chloride 0.9 % injection 3 mL  3 mL Intravenous PRN Therisa Doyne, MD      . traMADol (ULTRAM) tablet 50 mg  50 mg Oral Q6H PRN Therisa Doyne, MD      . DISCONTD: Polyethyl Glycol-Propyl Glycol 0.4-0.3 % SOLN 1 drop  1 drop Both Eyes TID Therisa Doyne, MD      . DISCONTD: polyethylene glycol (MIRALAX / GLYCOLAX) packet 17 g  17 g Oral Daily PRN Therisa Doyne, MD      . DISCONTD: potassium  chloride SA (K-DUR,KLOR-CON) CR tablet 40 mEq  40 mEq Oral Once Therisa Doyne, MD       Medications Prior to Admission  Medication Sig Dispense Refill  . acetaminophen (TYLENOL) 500 MG tablet Take 1,000 mg by mouth every 6 (six) hours as needed. For pain      . alendronate (FOSAMAX) 70 MG tablet Take 70 mg by mouth every 7 (seven) days. Take with a full glass of water on an empty stomach. Takes on Sunday.       Marland Kitchen amLODipine (NORVASC) 10 MG tablet Take 10 mg by mouth daily.        . bimatoprost (LUMIGAN) 0.01 % SOLN Place 1 drop into both eyes at bedtime.      . Calcium 1200-1000 MG-UNIT CHEW Chew 1 tablet by mouth daily.        . cholecalciferol (VITAMIN D) 1000 UNITS tablet Take 1,000 Units by mouth daily.        Marland Kitchen levothyroxine (SYNTHROID, LEVOTHROID) 88 MCG tablet Take 88 mcg by mouth daily.        . Multiple Vitamins-Minerals (ICAPS PO) Take 1 tablet by mouth daily.        Marland Kitchen oxyCODONE-acetaminophen (PERCOCET) 5-325 MG per tablet Take 2 tablets by mouth every 4 (four) hours as needed for pain.  15 tablet  0  . pantoprazole (PROTONIX) 40 MG tablet Take 40 mg by mouth 2 (two) times daily before a meal.      . Polyethyl Glycol-Propyl Glycol (SYSTANE OP) Place 1 drop into both eyes 3 (three) times daily.        Marland Kitchen tolterodine (DETROL) 2 MG tablet Take 2 mg by mouth daily.         Results for orders placed during the hospital encounter of 08/16/11 (from the past 48 hour(s))  CBC     Status: Normal   Collection Time   08/16/11  3:54 PM      Component Value Range Comment   WBC 8.8  4.0 - 10.5 (K/uL)    RBC 4.27  3.87 - 5.11 (MIL/uL)    Hemoglobin 13.1  12.0 - 15.0 (g/dL)    HCT 16.1  09.6 - 04.5 (%)    MCV 92.0  78.0 - 100.0 (fL)    MCH 30.7  26.0 - 34.0 (pg)    MCHC 33.3  30.0 - 36.0 (g/dL)    RDW 16.1  09.6 - 04.5 (%)    Platelets 347  150 - 400 (K/uL)   BASIC METABOLIC PANEL     Status: Abnormal   Collection Time   08/16/11  3:54 PM      Component Value Range Comment   Sodium  135  135 - 145 (mEq/L)    Potassium 3.8  3.5 - 5.1 (mEq/L)    Chloride 100  96 - 112 (mEq/L)    CO2 23  19 - 32 (mEq/L)    Glucose, Bld 88  70 - 99 (mg/dL)    BUN 20  6 - 23 (mg/dL)    Creatinine, Ser 4.09  0.50 - 1.10 (mg/dL)    Calcium 9.3  8.4 - 10.5 (mg/dL)    GFR calc non Af Amer 51 (*) >90 (mL/min)    GFR calc Af Amer 60 (*) >90 (mL/min)   URINALYSIS, ROUTINE W REFLEX MICROSCOPIC     Status: Abnormal   Collection Time   08/16/11  4:14 PM      Component Value Range Comment   Color, Urine YELLOW  YELLOW     APPearance CLEAR  CLEAR     Specific Gravity, Urine 1.011  1.005 - 1.030     pH 6.5  5.0 - 8.0     Glucose, UA NEGATIVE  NEGATIVE (mg/dL)    Hgb urine dipstick NEGATIVE  NEGATIVE     Bilirubin Urine NEGATIVE  NEGATIVE     Ketones, ur 15 (*) NEGATIVE (mg/dL)    Protein, ur NEGATIVE  NEGATIVE (mg/dL)    Urobilinogen, UA 0.2  0.0 - 1.0 (mg/dL)    Nitrite NEGATIVE  NEGATIVE     Leukocytes, UA NEGATIVE  NEGATIVE  MICROSCOPIC NOT DONE ON URINES WITH NEGATIVE PROTEIN, BLOOD, LEUKOCYTES, NITRITE, OR GLUCOSE <1000 mg/dL.  URINE CULTURE     Status: Normal   Collection Time   08/16/11  4:14 PM      Component Value Range Comment   Specimen Description URINE, CATHETERIZED      Special Requests NONE      Culture  Setup Time [id number]      Colony Count NO GROWTH      Culture NO GROWTH      Report Status 08/17/2011 FINAL     MAGNESIUM     Status: Normal   Collection Time   08/17/11  5:15 AM      Component Value Range Comment   Magnesium 2.0  1.5 - 2.5 (mg/dL)   PHOSPHORUS     Status: Normal   Collection Time   08/17/11  5:15 AM      Component Value Range Comment   Phosphorus 3.4  2.3 - 4.6 (mg/dL)   TSH     Status: Abnormal   Collection Time   08/17/11  5:15 AM      Component Value Range Comment   TSH 12.109 (*) 0.350 - 4.500 (uIU/mL)   COMPREHENSIVE METABOLIC PANEL     Status: Abnormal   Collection Time   08/17/11  5:15 AM      Component Value Range Comment   Sodium 138   135 - 145 (mEq/L)    Potassium 3.6  3.5 - 5.1 (mEq/L)    Chloride 106  96 - 112 (mEq/L)    CO2 23  19 - 32 (mEq/L)    Glucose, Bld 103 (*) 70 - 99 (mg/dL)    BUN 16  6 - 23 (mg/dL)  Creatinine, Ser 0.78  0.50 - 1.10 (mg/dL)    Calcium 8.8  8.4 - 10.5 (mg/dL)    Total Protein 5.7 (*) 6.0 - 8.3 (g/dL)    Albumin 2.8 (*) 3.5 - 5.2 (g/dL)    AST 21  0 - 37 (U/L)    ALT 16  0 - 35 (U/L)    Alkaline Phosphatase 137 (*) 39 - 117 (U/L)    Total Bilirubin 0.5  0.3 - 1.2 (mg/dL)    GFR calc non Af Amer 71 (*) >90 (mL/min)    GFR calc Af Amer 82 (*) >90 (mL/min)   CBC     Status: Abnormal   Collection Time   08/17/11  5:15 AM      Component Value Range Comment   WBC 8.2  4.0 - 10.5 (K/uL)    RBC 3.71 (*) 3.87 - 5.11 (MIL/uL)    Hemoglobin 11.0 (*) 12.0 - 15.0 (g/dL)    HCT 16.1 (*) 09.6 - 46.0 (%)    MCV 92.5  78.0 - 100.0 (fL)    MCH 29.6  26.0 - 34.0 (pg)    MCHC 32.1  30.0 - 36.0 (g/dL)    RDW 04.5  40.9 - 81.1 (%)    Platelets 327  150 - 400 (K/uL)    No results found.  ROS  Blood pressure 138/66, pulse 73, temperature 98 F (36.7 C), temperature source Oral, resp. rate 18, height 5\' 6"  (1.676 m), weight 138 lb 7.2 oz (62.8 kg), SpO2 95.00%. Physical Exam   Assessment/Plan Low back pain; radiates to rt buttock and leg; post fall Sacral fx on MRI; amenable to sacroplasty per Dr Corliss Skains Pt aware of procedure benefits and risks and agreeable to proceed. Consent signed and in chart   Raea Magallon A 08/18/2011, 9:49 AM

## 2011-08-18 NOTE — Progress Notes (Signed)
PATIENT DETAILS Name: [name] Age: 78 y.o. Sex: female Date of Birth: 11/22/1917 Admit Date: 08/16/2011 PCP:No primary provider on file.  Subjective: Continues to have back pain- relieved by narcotics  Objective: Vital signs in last 24 hours: Filed Vitals:   08/17/11 1107 08/17/11 1305 08/17/11 2111 08/18/11 0500  BP: 111/59 135/61 126/67 138/66  Pulse: 69 77 74 73  Temp: 98.1 F (36.7 C) 98.6 F (37 C) 98.2 F (36.8 C) 98 F (36.7 C)  TempSrc: Oral Oral Oral Oral  Resp: 18 18 18 18   Height:      Weight:      SpO2: 95% 93% 92% 95%    Weight change:   Body mass index is 22.35 kg/(m^2).  Intake/Output from previous day:  Intake/Output Summary (Last 24 hours) at 08/18/11 1155 Last data filed at 08/18/11 0900  Gross per 24 hour  Intake    720 ml  Output    700 ml  Net     20 ml    PHYSICAL EXAM: Gen Exam: Awake and alert with clear speech.   Neck: Supple, No JVD.   Chest: B/L Clear.   CVS: S1 S2 Regular, no murmurs. Abdomen: soft, BS +, non tender, non distended.  Extremities: no edema, lower extremities warm to touch. Neurologic: Non Focal.   Skin: No Rash.   Wounds: N/A.    CONSULTS:  None  LAB RESULTS: CBC  Lab 08/17/11 0515 08/16/11 1554 08/14/11 1622  WBC 8.2 8.8 7.6  HGB 11.0* 13.1 12.4  HCT 34.3* 39.3 38.2  PLT 327 347 351  MCV 92.5 92.0 92.3  MCH 29.6 30.7 30.0  MCHC 32.1 33.3 32.5  RDW 13.7 13.7 13.5  LYMPHSABS -- -- 1.4  MONOABS -- -- 0.4  EOSABS -- -- 0.2  BASOSABS -- -- 0.0  BANDABS -- -- --    Chemistries   Lab 08/17/11 0515 08/16/11 1554 08/14/11 1622  NA 138 135 135  K 3.6 3.8 3.3*  CL 106 100 102  CO2 23 23 24   GLUCOSE 103* 88 117*  BUN 16 20 14   CREATININE 0.78 0.94 0.83  CALCIUM 8.8 9.3 9.3  MG 2.0 -- --    GFR Estimated Creatinine Clearance: 42.9 ml/min (by C-G formula based on Cr of 0.78).  Coagulation profile No results found for this basename: INR:5,PROTIME:5 in the last 168 hours  Cardiac  Enzymes No results found for this basename: CK:3,CKMB:3,TROPONINI:3,MYOGLOBIN:3 in the last 168 hours  No components found with this basename: POCBNP:3 No results found for this basename: DDIMER:2 in the last 72 hours No results found for this basename: HGBA1C:2 in the last 72 hours No results found for this basename: CHOL:2,HDL:2,LDLCALC:2,TRIG:2,CHOLHDL:2,LDLDIRECT:2 in the last 72 hours  Basename 08/17/11 0515  TSH 12.109*  T4TOTAL --  T3FREE --  THYROIDAB --   No results found for this basename: VITAMINB12:2,FOLATE:2,FERRITIN:2,TIBC:2,IRON:2,RETICCTPCT:2 in the last 72 hours No results found for this basename: LIPASE:2,AMYLASE:2 in the last 72 hours  Urine Studies No results found for this basename: UACOL:2,UAPR:2,USPG:2,UPH:2,UTP:2,UGL:2,UKET:2,UBIL:2,UHGB:2,UNIT:2,UROB:2,ULEU:2,UEPI:2,UWBC:2,URBC:2,UBAC:2,CAST:2,CRYS:2,UCOM:2,BILUA:2 in the last 72 hours  MICROBIOLOGY: Recent Results (from the past 240 hour(s))  URINE CULTURE     Status: Normal   Collection Time   08/16/11  4:14 PM      Component Value Range Status Comment   Specimen Description URINE, CATHETERIZED   Final    Special Requests NONE   Final    Culture  Setup Time [id number]   Final    Colony Count NO GROWTH  Final    Culture NO GROWTH   Final    Report Status 08/17/2011 FINAL   Final     RADIOLOGY STUDIES/RESULTS: Mr Lumbar Spine Wo Contrast  08/13/2011  *RADIOLOGY REPORT*  Clinical Data: Status post fall 2 weeks ago with severe low back pain to the coccyx.  Worsened incontinence.  MRI LUMBAR SPINE WITHOUT CONTRAST  Technique:  Multiplanar and multiecho pulse sequences of the lumbar spine were obtained without intravenous contrast.  Comparison: Plain films of the lumbar spine and sacrum 08/10/2011.  Findings: Patient motion somewhat degrades the study.  There is partial visualization of a fracture of the sacrum seen in the S2 and S3 segments. Visualized portion of the fracture is nondisplaced.  No other  fracture is identified. The patient has 0.5 cm of anterolisthesis of L4 on L5 due to facet arthropathy. Alignment is otherwise unremarkable. Convex left scoliosis is noted.  The conus medullaris is normal signal and position.  Imaged intra-abdominal contents demonstrate marked distention of the urinary bladder.  The T10-11, T11-12 and T12-L1 levels imaged in the sagittal plane only and negative.  L1-2:  Negative.  L2-3:  Facet degenerative disease and mild disc bulge without central canal or foraminal stenosis.  L3-4:  Disc bulge and facet arthropathy identified.  Central canal is mildly to moderately narrowed.  Foramina appear open.  L4-5:  The disc is uncovered and bulging.  Ligamentum flavum thickening and facet arthropathy noted.  There is severe central canal and lateral recess stenosis.  The foramina appear open.  L5-S1:  Disc bulge with some facet degenerative disease.  Central canal and foramina appear open.  IMPRESSION: 1.  Partial visualization of an acute/subacute sacral fracture. Visualized portion of the fracture appears nondisplaced. 2.  Marked distention of the urinary bladder.  Cause of distention is not discretely visualized.  Cause for distention is not identified. 3.  Degenerative disease appearing worst at L4-5 where there is severe central canal and lateral recess stenosis.  Original Report Authenticated By: Bernadene Bell. D'ALESSIO, M.D.    MEDICATIONS: Scheduled Meds:    . amLODipine  10 mg Oral Daily  . bimatoprost  1 drop Both Eyes QHS  .  ceFAZolin (ANCEF) IV  1 g Intravenous Once  . docusate sodium  100 mg Oral BID  . enoxaparin (LOVENOX) injection  40 mg Subcutaneous q1800  . levothyroxine  88 mcg Oral Daily  . pantoprazole  40 mg Oral BID AC  . polyethylene glycol  17 g Oral Daily  . polyvinyl alcohol  1 drop Both Eyes TID  . senna  1 tablet Oral BID  . sodium chloride  3 mL Intravenous Q12H   Continuous Infusions:  PRN Meds:.sodium chloride, acetaminophen, acetaminophen,  acetaminophen, albuterol, alum & mag hydroxide-simeth, guaiFENesin-dextromethorphan, hydrALAZINE, HYDROcodone-acetaminophen, methocarbamol(ROBAXIN) IV, morphine, ondansetron (ZOFRAN) IV, ondansetron, sodium chloride, traMADol, DISCONTD: polyethylene glycol  Antibiotics: Anti-infectives     Start     Dose/Rate Route Frequency Ordered Stop   08/19/11 0600   ceFAZolin (ANCEF) IVPB 1 g/50 mL premix        1 g 100 mL/hr over 30 Minutes Intravenous  Once 08/18/11 1003            Assessment/Plan: Patient Active Hospital Problem List: Low Back pain  -intractable -secondary to sacral fractures -d/w Dr Jenkins-NeuroSurgery on call-radiology studies and clinical features discussed, he did not advise any surgical options, advised to consider Sacroplasty, s  -kyphoplasty/Sacroplasty in am -continue with as needed Norco -add Miralax and continue with Senokot  Urinary Retention with ? Overflow incontinence -? 2/2 to pelvic pain/low back pain -spoke with Dr Annabell Howells, case discussed, he advises to keep foley in place, and have patient follow up with him in 2 weeks or so for a possible voiding trial.  Hypertension  -controlled -continue with amlodipine  Hypothyroidism -continue with Levothyroxine    Disposition: Remain inpatient-await  PT eval, wants to go home, has already arranged for 24 care  DVT Prophylaxis: Prophylactic Lovenox   Code Status: DNR  Maretta Bees,  MD. 08/18/2011, 11:55 AM

## 2011-08-18 NOTE — Evaluation (Signed)
Physical Therapy Evaluation and Discharge.   Patient Details Name: [name] MRN: [id number] DOB: December 30, 1917 Today's Date: 08/18/2011  Problem List:  Patient Active Problem List  Diagnoses  . Hypertension  . Hypothyroid  . Chest pain, unspecified  . Leukocytosis  . Back pain  . Sacral fracture, closed  . Urinary retention    Past Medical History:  Past Medical History  Diagnosis Date  . Hypertension   . Thyroid disease   . Hypothyroid    Past Surgical History:  Past Surgical History  Procedure Date  . Brain surgery     PT Assessment/Plan/Recommendation PT Assessment Clinical Impression Statement: Pt evaluated by PT.  Pt requires no assistance for mobility, transfers or gait.  Pt c/o Right sacral pain and Right LE pain in standing but able to complete evaluation.  At this time pt presents with no acute PT needs. Pt scheduled for kyphoplasty tomorrow.  Will need re-order if PT eval is wanted after surgery.   PT Recommendation/Assessment: All further PT needs can be met in the next venue of care PT Problem List: Decreased mobility;Pain;Decreased activity tolerance Problem List Comments: Pt was independent with no assistive device prior to fall.  Will benefit from HHPT follow-up to return pt to her baseline level of function.  PT Therapy Diagnosis : Difficulty walking;Acute pain No Skilled PT: Patient is modified independent with all activity/mobility PT Recommendation Follow Up Recommendations: Home health PT;Supervision - Intermittent Equipment Recommended: None recommended by PT PT Goals     PT Evaluation Precautions/Restrictions  Precautions Precautions: Fall Precaution Comments: fell at sisters's home 2 weeks ago.  Prior Functioning  Home Living Lives With: Alone Receives Help From: Personal care attendant (24 hour h) Type of Home: House Home Layout: Two level;Able to live on main level with bedroom/bathroom;Laundry or work area in basement Alternate  Level Stairs-Rails: None Alternate Level Stairs-Number of Steps: 13 (has chair lift ) Home Access: Level entry Bathroom Shower/Tub: Walk-in shower;Door [employer] Toilet: Handicapped height Bathroom Accessibility: Yes How Accessible: Accessible via wheelchair;Accessible via walker Home Adaptive Equipment: Walker - rolling;Shower chair with back;Grab bars around toilet;Grab bars in shower;Hand-held shower hose;Straight cane (stair lift ) Prior Function Level of Independence: Independent with basic ADLs;Independent with gait;Independent with homemaking with ambulation;Independent with transfers Able to Take Stairs?: Yes Driving: Yes 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 Arousal/Alertness: Awake/alert Overall Cognitive Status: Appears within functional limits for tasks assessed Sensation/Coordination Sensation Light Touch: Appears Intact Stereognosis: Not tested Hot/Cold: Not tested Proprioception: Appears Intact Coordination Gross Motor Movements are Fluid and Coordinated: Yes Fine Motor Movements are Fluid and Coordinated: Yes Extremity Assessment RUE Assessment RUE Assessment: Within Functional Limits LUE Assessment LUE Assessment: Within Functional Limits RLE Assessment RLE Assessment: Within Functional Limits LLE Assessment LLE Assessment: Within Functional Limits Mobility (including Balance) Bed Mobility Bed Mobility: Yes Rolling Right: 7: Independent Right Sidelying to Sit: 6: Modified independent (Device/Increase time);HOB flat Sit to Sidelying Right: 6: Modified independent (Device/Increase time);HOB flat Transfers Transfers: Yes Sit to Stand: 6: Modified independent (Device/Increase time);From bed Stand to Sit: 6: Modified independent (Device/Increase time);To bed Ambulation/Gait Ambulation/Gait: Yes Ambulation/Gait Assistance: 6: Modified independent (Device/Increase time) Ambulation Distance (Feet): 30 Feet Assistive device: Rolling walker Gait Pattern:  Decreased stride length;Decreased weight shift to right Stairs: No Wheelchair Mobility Wheelchair Mobility: No  Posture/Postural Control Posture/Postural Control: No significant limitations Balance Balance Assessed: Yes Static Sitting Balance Static Sitting - Balance Support: No upper extremity supported Static Sitting - Level of Assistance: 7: Independent Static Sitting - Comment/#  of Minutes: 4 min on EOB with no LOB. C/o mild dizziness but able to continue.   Exercise    End of Session PT - End of Session Equipment Utilized During Treatment: Gait belt Activity Tolerance: Patient tolerated treatment well Patient left: in bed;with call bell in reach;with bed alarm set Nurse Communication: Mobility status for transfers;Mobility status for ambulation General Behavior During Session: [facility] for tasks performed Cognition: [facility] for tasks performed  Devetta Hagenow 08/18/2011, 2:55 PM Matthias Bogus L. Leaira Fullam DPT [phone]

## 2011-08-18 NOTE — Progress Notes (Deleted)
08/18/2011 Lolamae Voisin [facility] Case Management Note [phone]    CARE MANAGEMENT NOTE 08/18/2011  Patient:  [name],[name]   Account Number:  [id number]  Date Initiated:  08/15/2011  Documentation initiated by:  SHAVIS,ALESIA  Subjective/Objective Assessment:   Chest pain, CVA, DM     Action/Plan:   lives at shelter   Anticipated DC Date:  08/17/2011   Anticipated DC Plan:  HOME/SELF CARE  In-house referral  Clinical Social Worker      DC Planning Services  CM consult  Medication Assistance  [facility]igent [facility]      Choice offered to / List presented to:             Status of service:  In process, will continue to follow Medicare Important Message given?   (If response is "NO", the following Medicare IM given date fields will be blank) Date Medicare IM given:   Date Additional Medicare IM given:    Discharge Disposition:    Per UR Regulation:    Comments:  08/18/11-1433- J.Nochum Fenter,RN,BSN  [phone]      In to speak with patient regarding home/discharge needs. Prescriptions sent to pharmacy to fill using ZZ fund. [facility] appointment information given to patient. Questions answered. No further needs identified.  08/18/11- 1403- J.Garrie Woodin,RN,BSN  [phone]      Per request of physician and patient. [facility] contacted for appointment. Spoke with Kylea at [facility]. Elligibility appointment made for Wednesday, 09/16/11 at 11:00 and follow-up appt made for Monday, 09/21/11 at 10:45 with Dr. Amao. Information to be given to patient and placed on discharge follow up section.   08/15/2011 1530 Spoke to pt and states he lives at [facility] prior to admission. Had Case Worker, Dottie assisting him. Requested NCM reach out to her at [phone]. Contacted Dottie, Case Worker with [employer]. States she is working with helping him. They are assisting him with his disability appts. Will need PT/OT consult for him while IP. His DME was lost. He needs glucometer to check blood sugar,  [facility] appt and meds assistance. Contacted attending for PT/OT consult. Will have NCM schedule [facility] appt on 2/18 or 2/19 (federal holiday on 2/18). Contacted main pharmacy and pt is eligible for Z[employer] med assistance fund. Left note for MD to complete 3 day Rx for [facility] main pharmacy. Will possible need vial of insulin. Will speak with unit RN for DM teaching for pt. CSW consult pending. Alesia Shavis RN CCM Case Mgmt phone [phone]    

## 2011-08-19 ENCOUNTER — Other Ambulatory Visit (HOSPITAL_COMMUNITY): Payer: Medicare Other

## 2011-08-19 ENCOUNTER — Observation Stay (HOSPITAL_COMMUNITY): Payer: Medicare Other

## 2011-08-19 DIAGNOSIS — R339 Retention of urine, unspecified: Secondary | ICD-10-CM | POA: Diagnosis not present

## 2011-08-19 DIAGNOSIS — IMO0002 Reserved for concepts with insufficient information to code with codable children: Secondary | ICD-10-CM | POA: Diagnosis not present

## 2011-08-19 DIAGNOSIS — I10 Essential (primary) hypertension: Secondary | ICD-10-CM | POA: Diagnosis not present

## 2011-08-19 DIAGNOSIS — M545 Low back pain: Secondary | ICD-10-CM | POA: Diagnosis not present

## 2011-08-19 DIAGNOSIS — M8448XA Pathological fracture, other site, initial encounter for fracture: Secondary | ICD-10-CM | POA: Diagnosis not present

## 2011-08-19 LAB — PROTIME-INR
INR: 1.11 (ref 0.00–1.49)
Prothrombin Time: 14.5 seconds (ref 11.6–15.2)

## 2011-08-19 LAB — BASIC METABOLIC PANEL
GFR calc Af Amer: 72 mL/min — ABNORMAL LOW (ref >90)
GFR calc non Af Amer: 63 mL/min — ABNORMAL LOW (ref >90)
Potassium: 3.4 mEq/L — ABNORMAL LOW (ref 3.5–5.1)
Sodium: 136 mEq/L (ref 135–145)

## 2011-08-19 LAB — CBC
MCHC: 32.4 g/dL (ref 30.0–36.0)
RDW: 13.8 % (ref 11.5–15.5)

## 2011-08-19 MED ORDER — OXYCODONE-ACETAMINOPHEN 5-325 MG PO TABS: 1.0000 | ORAL_TABLET | ORAL | Status: AC | PRN

## 2011-08-19 MED ORDER — IOHEXOL 300 MG/ML  SOLN
50.0000 mL | Freq: Once | INTRAMUSCULAR | Status: AC | PRN
  Administered 2011-08-19: 10 mL

## 2011-08-19 MED ORDER — POLYETHYLENE GLYCOL 3350 17 G PO PACK: 17.0000 g | PACK | Freq: Every day | ORAL | Status: AC

## 2011-08-19 MED ORDER — TOBRAMYCIN SULFATE 1.2 G IJ SOLR
INTRAMUSCULAR | Status: AC
  Administered 2011-08-19: 1.2 g
  Filled 2011-08-19: qty 1.2

## 2011-08-19 MED ORDER — MIDAZOLAM HCL 5 MG/5ML IJ SOLN
INTRAMUSCULAR | Status: DC | PRN
  Administered 2011-08-19 (×2): 1 mg via INTRAVENOUS

## 2011-08-19 MED ORDER — FENTANYL CITRATE 0.05 MG/ML IJ SOLN
INTRAMUSCULAR | Status: DC | PRN
  Administered 2011-08-19 (×2): 25 ug via INTRAVENOUS

## 2011-08-19 MED ORDER — CEFAZOLIN SODIUM 1-5 GM-% IV SOLN
INTRAVENOUS | Status: AC
  Administered 2011-08-19: 1 g via INTRAVENOUS
  Filled 2011-08-19: qty 50

## 2011-08-19 NOTE — Procedures (Signed)
S/p sacrplasty(VP) at S1 for painful fracture.

## 2011-08-19 NOTE — Progress Notes (Signed)
Utilization Review Completed.[name] T2/20/2013   

## 2011-08-19 NOTE — Discharge Summary (Signed)
HOSPITAL DISCHARGE SUMMARY  CHARMAN BLASCO  MRN: [id number]  DOB:01/01/1918  Date of Admission: 08/16/2011 Date of Discharge: 08/19/2011         LOS: 3 days   Attending Physician:Daquane Aguilar A  Patient's OZH:YQMVHQI,ONGEX, MD, MD  Consults: Dr Annabell Howells  Discharge Diagnoses: Present on Admission:  .Hypertension .Hypokalemia .Back pain .Sacral fracture, closed .Urinary retention   Medication List  As of 08/19/2011  1:38 PM   TAKE these medications         acetaminophen 500 MG tablet   Commonly known as: TYLENOL   Take 1,000 mg by mouth every 6 (six) hours as needed. For pain      alendronate 70 MG tablet   Commonly known as: FOSAMAX   Take 70 mg by mouth every 7 (seven) days. Take with a full glass of water on an empty stomach. Takes on Sunday.        amLODipine 10 MG tablet   Commonly known as: NORVASC   Take 10 mg by mouth daily.      bimatoprost 0.01 % Soln   Commonly known as: LUMIGAN   Place 1 drop into both eyes at bedtime.      Calcium 1200-1000 MG-UNIT Chew   Chew 1 tablet by mouth daily.      cholecalciferol 1000 UNITS tablet   Commonly known as: VITAMIN D   Take 1,000 Units by mouth daily.      ICAPS PO   Take 1 tablet by mouth daily.      levothyroxine 88 MCG tablet   Commonly known as: SYNTHROID, LEVOTHROID   Take 88 mcg by mouth daily.      oxyCODONE-acetaminophen 5-325 MG per tablet   Commonly known as: PERCOCET   Take 1-2 tablets by mouth every 4 (four) hours as needed for pain.      pantoprazole 40 MG tablet   Commonly known as: PROTONIX   Take 40 mg by mouth 2 (two) times daily before a meal.      polyethylene glycol packet   Commonly known as: MIRALAX / GLYCOLAX   Take 17 g by mouth daily.      SYSTANE OP   Place 1 drop into both eyes 3 (three) times daily.      tolterodine 2 MG tablet   Commonly known as: DETROL   Take 2 mg by mouth daily.             Brief Admission History: MENDE BISWELL is a 78 y.o. female has  a past medical history of Hypertension; Thyroid disease; and Hypothyroid. Presented with Fall 2 wks ago at first she tolearted it well but then developed pain in legs bilateraly and what sounds like overflow urinary incontinence due to retantion. States when she used to sit on the toilet she was unable to void but once she would stand up urine would leak spontaneously. She had an MRI done that showed sacral fracture but no chord compression. Patient states she is in significant pain with ambulation. She lives alone. She still have some urinary retantion but that seems to be imroving. Pain partially controlled with PO meds. Patient was seen for the same in ED on the 2/15 and was send home with percocet. Unfortunately she still has trouble ambulating and her PCP send her in to be admitted for what sounds to be pain management. Patient would likely benefit from placement.   Hospital Course: Present on Admission:  .Hypertension .Hypokalemia .Back pain .Sacral fracture, closed .Urinary retention  1. Sacral fracture: This is likely secondary to fall. Patient evaluated by neurosurgery and they recommend a sacral plasty. Interventional radiology was called in and they did sacroplasty earlier today. Patient felt better with the pain improved.  2. Back pain: This is secondary to the sacral fracture. As mentioned above patient undergone sacral plasty, done by interventional radiology. Patient is okay to go home. She is on Percocet for the pain. Patient evaluated by the PT/OT and they recommended for her to go home with home health service. All health service set up already.  3. Urinary retention: Patient presented with urinary retention and overflow incontinence at the time of admission. It was thought initially cauda equina syndrome: The fall and then the incontinence. Foley catheter was put in and drained the urine. And attention was evaluated by Dr. Annabell Howells from urology. He attributed that probably to the pelvic  pain plus the narcotics which can cause urinary retention. There was no evidence of cord compression or nerve rotation and the MRIs. Patient will be going home as Foley catheter to followup with Dr. Annabell Howells in 2 weeks.  4. Hypothyroidism: This is controlled continue current medication no changes were done.  5. Hypertension: Controlled.   Day of Discharge BP 125/61  Pulse 82  Temp(Src) 98 F (36.7 C) (Oral)  Resp 11  Ht 5\' 6"  (1.676 m)  Wt 62.8 kg (138 lb 7.2 oz)  BMI 22.35 kg/m2  SpO2 98% Physical Exam: GEN: No acute distress, cooperative with exam PSYCH: He is alert and oriented x4; does not appear anxious does not appear depressed; affect is normal  HEENT: Mucous membranes pink and anicteric;  Mouth: without oral thrush or lesions Eyes: PERRLA; EOM intact;  Neck: no cervical lymphadenopathy nor thyromegaly or carotid bruit; no JVD;  CHEST WALL: No tenderness, symmetrical to breathing bilaterally CHEST: Normal respiration, clear to auscultation bilaterally  HEART: Regular rate and rhythm; no murmurs, rubs or gallops, S1 and S2 heard  BACK: No kyphosis or scoliosis; no CVA tenderness  ABDOMEN:  soft non-tender; no masses, no organomegaly, normal abdominal bowel sounds; no pannus; no intertriginous candida.  EXTREMITIES: No bone or joint deformity; no edema; no ulcerations.  PULSES: 2+ and symmetric, neurovascularity is intact SKIN: Normal hydration no rash or ulceration, no flushing or suspicious lesions  CNS: Cranial nerves 2-12 grossly intact no focal neurologic deficit, coordination is intact gait not tested    Results for orders placed during the hospital encounter of 08/16/11 (from the past 24 hour(s))  CBC     Status: Abnormal   Collection Time   08/19/11  5:05 AM      Component Value Range   WBC 8.1  4.0 - 10.5 (K/uL)   RBC 3.67 (*) 3.87 - 5.11 (MIL/uL)   Hemoglobin 11.0 (*) 12.0 - 15.0 (g/dL)   HCT 40.9 (*) 81.1 - 46.0 (%)   MCV 92.4  78.0 - 100.0 (fL)   MCH 30.0   26.0 - 34.0 (pg)   MCHC 32.4  30.0 - 36.0 (g/dL)   RDW 91.4  78.2 - 95.6 (%)   Platelets 354  150 - 400 (K/uL)  BASIC METABOLIC PANEL     Status: Abnormal   Collection Time   08/19/11  5:05 AM      Component Value Range   Sodium 136  135 - 145 (mEq/L)   Potassium 3.4 (*) 3.5 - 5.1 (mEq/L)   Chloride 103  96 - 112 (mEq/L)   CO2 25  19 - 32 (mEq/L)  Glucose, Bld 92  70 - 99 (mg/dL)   BUN 15  6 - 23 (mg/dL)   Creatinine, Ser 4.09  0.50 - 1.10 (mg/dL)   Calcium 8.8  8.4 - 81.1 (mg/dL)   GFR calc non Af Amer 63 (*) >90 (mL/min)   GFR calc Af Amer 72 (*) >90 (mL/min)  APTT     Status: Normal   Collection Time   08/19/11  5:05 AM      Component Value Range   aPTT 32  24 - 37 (seconds)  PROTIME-INR     Status: Normal   Collection Time   08/19/11  5:05 AM      Component Value Range   Prothrombin Time 14.5  11.6 - 15.2 (seconds)   INR 1.11  0.00 - 1.49     Disposition: Home   Follow-up Appts: Discharge Orders    Future Orders Please Complete By Expires   Diet - low sodium heart healthy      Increase activity slowly         Follow-up Information    Follow up with Anner Crete, MD. Schedule an appointment as soon as possible for a visit in 2 weeks.   Contact information:   [address] [place] [address] [place] [address] [phone]       Follow up with [facility], MD. Schedule an appointment as soon as possible for a visit in 2 weeks.   Contact information:   [address], [address] [place] [address] [phone]          I spent 40 minutes completing paperwork and coordinating discharge efforts.  SignedClydia Llano A 08/19/2011, 1:38 PM

## 2011-08-19 NOTE — ED Notes (Signed)
Pt on 4L of O2 via [place]

## 2011-08-19 NOTE — ED Notes (Signed)
O2 decreased to 2L [place]

## 2011-08-21 ENCOUNTER — Other Ambulatory Visit (HOSPITAL_COMMUNITY): Payer: Self-pay | Admitting: Interventional Radiology

## 2011-08-21 DIAGNOSIS — IMO0002 Reserved for concepts with insufficient information to code with codable children: Secondary | ICD-10-CM

## 2011-08-23 ENCOUNTER — Encounter (HOSPITAL_COMMUNITY): Payer: Self-pay | Admitting: *Deleted

## 2011-08-25 ENCOUNTER — Telehealth (HOSPITAL_COMMUNITY): Payer: Self-pay

## 2011-08-28 DIAGNOSIS — R339 Retention of urine, unspecified: Secondary | ICD-10-CM | POA: Diagnosis not present

## 2011-08-31 DIAGNOSIS — S322XXA Fracture of coccyx, initial encounter for closed fracture: Secondary | ICD-10-CM | POA: Diagnosis not present

## 2011-08-31 DIAGNOSIS — R339 Retention of urine, unspecified: Secondary | ICD-10-CM | POA: Diagnosis not present

## 2011-08-31 DIAGNOSIS — S3210XA Unspecified fracture of sacrum, initial encounter for closed fracture: Secondary | ICD-10-CM | POA: Diagnosis not present

## 2011-08-31 DIAGNOSIS — E039 Hypothyroidism, unspecified: Secondary | ICD-10-CM | POA: Diagnosis not present

## 2011-08-31 DIAGNOSIS — K3189 Other diseases of stomach and duodenum: Secondary | ICD-10-CM | POA: Diagnosis not present

## 2011-08-31 DIAGNOSIS — N3941 Urge incontinence: Secondary | ICD-10-CM | POA: Diagnosis not present

## 2011-08-31 DIAGNOSIS — M810 Age-related osteoporosis without current pathological fracture: Secondary | ICD-10-CM | POA: Diagnosis not present

## 2011-08-31 DIAGNOSIS — I10 Essential (primary) hypertension: Secondary | ICD-10-CM | POA: Diagnosis not present

## 2011-09-02 ENCOUNTER — Ambulatory Visit (HOSPITAL_COMMUNITY): Payer: Medicare Other | Attending: Interventional Radiology

## 2011-09-10 NOTE — Telephone Encounter (Signed)
  Type Contact Phone 08/25/2011 3:23 PM Phone (Outgoing) [place], Nemiah Bubar (Self) [phone] (H) spoke w/ Ms. Riggan she is feeling much better// she has only taken 2 tylenol today// I made her and her helper aware of her f/u appt

## 2011-09-22 DIAGNOSIS — H43819 Vitreous degeneration, unspecified eye: Secondary | ICD-10-CM | POA: Diagnosis not present

## 2011-09-22 DIAGNOSIS — H4011X0 Primary open-angle glaucoma, stage unspecified: Secondary | ICD-10-CM | POA: Diagnosis not present

## 2011-09-22 DIAGNOSIS — H02039 Senile entropion of unspecified eye, unspecified eyelid: Secondary | ICD-10-CM | POA: Diagnosis not present

## 2011-09-22 DIAGNOSIS — H3530 Unspecified macular degeneration: Secondary | ICD-10-CM | POA: Diagnosis not present

## 2011-10-13 DIAGNOSIS — S3210XA Unspecified fracture of sacrum, initial encounter for closed fracture: Secondary | ICD-10-CM | POA: Diagnosis not present

## 2011-12-16 DIAGNOSIS — H02039 Senile entropion of unspecified eye, unspecified eyelid: Secondary | ICD-10-CM | POA: Diagnosis not present

## 2011-12-16 DIAGNOSIS — Z961 Presence of intraocular lens: Secondary | ICD-10-CM | POA: Diagnosis not present

## 2011-12-16 DIAGNOSIS — H40059 Ocular hypertension, unspecified eye: Secondary | ICD-10-CM | POA: Diagnosis not present

## 2011-12-16 DIAGNOSIS — H3530 Unspecified macular degeneration: Secondary | ICD-10-CM | POA: Diagnosis not present

## 2012-01-13 DIAGNOSIS — R7989 Other specified abnormal findings of blood chemistry: Secondary | ICD-10-CM | POA: Diagnosis not present

## 2012-01-13 DIAGNOSIS — N3941 Urge incontinence: Secondary | ICD-10-CM | POA: Diagnosis not present

## 2012-01-13 DIAGNOSIS — K5909 Other constipation: Secondary | ICD-10-CM | POA: Diagnosis not present

## 2012-01-13 DIAGNOSIS — E039 Hypothyroidism, unspecified: Secondary | ICD-10-CM | POA: Diagnosis not present

## 2012-01-13 DIAGNOSIS — H3530 Unspecified macular degeneration: Secondary | ICD-10-CM | POA: Diagnosis not present

## 2012-01-13 DIAGNOSIS — M810 Age-related osteoporosis without current pathological fracture: Secondary | ICD-10-CM | POA: Diagnosis not present

## 2012-01-13 DIAGNOSIS — I10 Essential (primary) hypertension: Secondary | ICD-10-CM | POA: Diagnosis not present

## 2012-03-02 DIAGNOSIS — R609 Edema, unspecified: Secondary | ICD-10-CM | POA: Diagnosis not present

## 2012-03-04 DIAGNOSIS — H02039 Senile entropion of unspecified eye, unspecified eyelid: Secondary | ICD-10-CM | POA: Diagnosis not present

## 2012-03-04 DIAGNOSIS — H01139 Eczematous dermatitis of unspecified eye, unspecified eyelid: Secondary | ICD-10-CM | POA: Diagnosis not present

## 2012-03-04 DIAGNOSIS — R609 Edema, unspecified: Secondary | ICD-10-CM | POA: Diagnosis not present

## 2012-03-04 DIAGNOSIS — H01009 Unspecified blepharitis unspecified eye, unspecified eyelid: Secondary | ICD-10-CM | POA: Diagnosis not present

## 2012-03-04 DIAGNOSIS — R82998 Other abnormal findings in urine: Secondary | ICD-10-CM | POA: Diagnosis not present

## 2012-03-16 DIAGNOSIS — H02139 Senile ectropion of unspecified eye, unspecified eyelid: Secondary | ICD-10-CM | POA: Diagnosis not present

## 2012-03-16 DIAGNOSIS — H01009 Unspecified blepharitis unspecified eye, unspecified eyelid: Secondary | ICD-10-CM | POA: Diagnosis not present

## 2012-03-16 DIAGNOSIS — H02039 Senile entropion of unspecified eye, unspecified eyelid: Secondary | ICD-10-CM | POA: Diagnosis not present

## 2012-03-31 DIAGNOSIS — Z23 Encounter for immunization: Secondary | ICD-10-CM | POA: Diagnosis not present

## 2012-04-13 DIAGNOSIS — H02039 Senile entropion of unspecified eye, unspecified eyelid: Secondary | ICD-10-CM | POA: Diagnosis not present

## 2012-04-13 DIAGNOSIS — H01009 Unspecified blepharitis unspecified eye, unspecified eyelid: Secondary | ICD-10-CM | POA: Diagnosis not present

## 2012-05-19 DIAGNOSIS — H02039 Senile entropion of unspecified eye, unspecified eyelid: Secondary | ICD-10-CM | POA: Diagnosis not present

## 2012-05-19 DIAGNOSIS — H3530 Unspecified macular degeneration: Secondary | ICD-10-CM | POA: Diagnosis not present

## 2012-05-19 DIAGNOSIS — H10029 Other mucopurulent conjunctivitis, unspecified eye: Secondary | ICD-10-CM | POA: Diagnosis not present

## 2012-05-19 DIAGNOSIS — H4011X0 Primary open-angle glaucoma, stage unspecified: Secondary | ICD-10-CM | POA: Diagnosis not present

## 2012-07-15 DIAGNOSIS — M810 Age-related osteoporosis without current pathological fracture: Secondary | ICD-10-CM | POA: Diagnosis not present

## 2012-07-15 DIAGNOSIS — N3941 Urge incontinence: Secondary | ICD-10-CM | POA: Diagnosis not present

## 2012-07-15 DIAGNOSIS — I10 Essential (primary) hypertension: Secondary | ICD-10-CM | POA: Diagnosis not present

## 2012-07-15 DIAGNOSIS — E039 Hypothyroidism, unspecified: Secondary | ICD-10-CM | POA: Diagnosis not present

## 2012-09-14 DIAGNOSIS — H01009 Unspecified blepharitis unspecified eye, unspecified eyelid: Secondary | ICD-10-CM | POA: Diagnosis not present

## 2012-09-14 DIAGNOSIS — Z961 Presence of intraocular lens: Secondary | ICD-10-CM | POA: Diagnosis not present

## 2012-09-14 DIAGNOSIS — IMO0002 Reserved for concepts with insufficient information to code with codable children: Secondary | ICD-10-CM | POA: Diagnosis not present

## 2012-09-14 DIAGNOSIS — H532 Diplopia: Secondary | ICD-10-CM | POA: Diagnosis not present

## 2012-09-26 DIAGNOSIS — I679 Cerebrovascular disease, unspecified: Secondary | ICD-10-CM | POA: Diagnosis not present

## 2012-10-13 DIAGNOSIS — M25569 Pain in unspecified knee: Secondary | ICD-10-CM | POA: Diagnosis not present

## 2012-10-14 ENCOUNTER — Encounter (HOSPITAL_COMMUNITY): Payer: Self-pay | Admitting: Emergency Medicine

## 2012-10-14 ENCOUNTER — Emergency Department (HOSPITAL_COMMUNITY)
Admission: EM | Admit: 2012-10-14 | Discharge: 2012-10-14 | Disposition: A | Discharge status: Home / Self Care | Payer: Medicare Other | Attending: Emergency Medicine | Admitting: Emergency Medicine

## 2012-10-14 DIAGNOSIS — M7989 Other specified soft tissue disorders: Secondary | ICD-10-CM | POA: Insufficient documentation

## 2012-10-14 DIAGNOSIS — I82409 Acute embolism and thrombosis of unspecified deep veins of unspecified lower extremity: Secondary | ICD-10-CM | POA: Diagnosis not present

## 2012-10-14 DIAGNOSIS — I10 Essential (primary) hypertension: Secondary | ICD-10-CM | POA: Insufficient documentation

## 2012-10-14 DIAGNOSIS — M79609 Pain in unspecified limb: Secondary | ICD-10-CM | POA: Diagnosis not present

## 2012-10-14 DIAGNOSIS — Z79899 Other long term (current) drug therapy: Secondary | ICD-10-CM | POA: Diagnosis not present

## 2012-10-14 DIAGNOSIS — R609 Edema, unspecified: Secondary | ICD-10-CM | POA: Diagnosis not present

## 2012-10-14 DIAGNOSIS — I824Z9 Acute embolism and thrombosis of unspecified deep veins of unspecified distal lower extremity: Secondary | ICD-10-CM | POA: Diagnosis not present

## 2012-10-14 DIAGNOSIS — I82402 Acute embolism and thrombosis of unspecified deep veins of left lower extremity: Secondary | ICD-10-CM

## 2012-10-14 DIAGNOSIS — E079 Disorder of thyroid, unspecified: Secondary | ICD-10-CM | POA: Diagnosis not present

## 2012-10-14 DIAGNOSIS — M25469 Effusion, unspecified knee: Secondary | ICD-10-CM | POA: Insufficient documentation

## 2012-10-14 DIAGNOSIS — E039 Hypothyroidism, unspecified: Secondary | ICD-10-CM | POA: Insufficient documentation

## 2012-10-14 HISTORY — DX: Acute embolism and thrombosis of unspecified deep veins of unspecified lower extremity: I82.409

## 2012-10-14 MED ORDER — RIVAROXABAN 15 MG PO TABS: 15.0000 mg | ORAL_TABLET | Freq: Two times a day (BID) | ORAL | Status: DC

## 2012-10-14 MED ORDER — RIVAROXABAN 15 MG PO TABS: 15.0000 mg | ORAL_TABLET | Freq: Once | ORAL | Status: DC

## 2012-10-14 NOTE — Progress Notes (Signed)
Bilateral lower extremity venous duplex completed.  Right:  No evidence of DVT, superficial thrombosis, or Baker's cyst.  Left: Partial DVT noted in the popliteal vein.  No evidence of superficial thrombosis.  No Baker's cyst.   

## 2012-10-14 NOTE — ED Notes (Signed)
Pt from home c/o left knee pain since WED. Pt was evaluated at [facility] for same, was told to have ultrasound this AM.

## 2012-10-14 NOTE — ED Provider Notes (Signed)
History     CSN: [id number]  Arrival date & time 10/14/12  0825   First MD Initiated Contact with Patient 10/14/12 [phone]      Chief Complaint  Patient presents with  . Knee Pain    (Consider location/radiation/quality/duration/timing/severity/associated sxs/prior treatment) HPI 79 yo F with HTN, hypothyroidism, h/o DVT in 2008 presenting with left knee pain.  She states that it is a sharp pain throughout her knee.  It started 2 days ago, initially occurred when she was sitting in her recliner and may have rotated her knee some.  Pain is sharp and only lasts seconds to minutes.  Now the pain is present whenever she puts weight on that leg.  Still able to walk with her walker.  Denies any locking or giving way.  No trauma or falls.  She was seen by a physician at [facility] yesterday for this problem.  They did x-rays which she reports were normal.  The doctor called her and told her to go the emergency room for an ultrasound of her leg for possible clot.  Past Medical History  Diagnosis Date  . Hypertension   . Thyroid disease   . Hypothyroid   . DVT (deep venous thrombosis)     pt states she has hx of dvt in 2008    Past Surgical History  Procedure Laterality Date  . Brain surgery      Family History  Problem Relation Age of Onset  . Heart disease Mother     History  Substance Use Topics  . Smoking status: Never Smoker   . Smokeless tobacco: Not on file  . Alcohol Use: No    OB History   Grav Para Term Preterm Abortions TAB SAB Ect Mult Living                  Review of Systems  Musculoskeletal:       Left knee pain  All other systems reviewed and are negative.    Allergies  Review of patient's allergies indicates no known allergies.  Home Medications   Current Outpatient Rx  Name  Route  Sig  Dispense  Refill  . acetaminophen (TYLENOL) 500 MG tablet   Oral   Take 1,000 mg by mouth every 6 (six) hours as needed. For pain         . alendronate (FOSAMAX)  70 MG tablet   Oral   Take 70 mg by mouth every 7 (seven) days. Take with a full glass of water on an empty stomach. Takes on Sunday.          Marland Kitchen amLODipine (NORVASC) 10 MG tablet   Oral   Take 10 mg by mouth daily.           . bimatoprost (LUMIGAN) 0.01 % SOLN   Both Eyes   Place 1 drop into both eyes at bedtime.         . Calcium 1200-1000 MG-UNIT CHEW   Oral   Chew 1 tablet by mouth daily.           . cholecalciferol (VITAMIN D) 1000 UNITS tablet   Oral   Take 1,000 Units by mouth daily.           Marland Kitchen levothyroxine (SYNTHROID, LEVOTHROID) 88 MCG tablet   Oral   Take 88 mcg by mouth daily.           . Multiple Vitamins-Minerals (ICAPS PO)   Oral   Take 1 tablet by mouth daily.           Marland Kitchen  EXPIRED: pantoprazole (PROTONIX) 40 MG tablet   Oral   Take 40 mg by mouth 2 (two) times daily before a meal.         . Polyethyl Glycol-Propyl Glycol (SYSTANE OP)   Both Eyes   Place 1 drop into both eyes 3 (three) times daily.           Marland Kitchen tolterodine (DETROL) 2 MG tablet   Oral   Take 2 mg by mouth daily.            BP 130/73  Pulse 73  Temp(Src) 97.1 F (36.2 C) (Oral)  SpO2 96%  Physical Exam  Constitutional: She is oriented to person, place, and time. She appears well-developed and well-nourished. No distress.  Musculoskeletal: She exhibits edema (non-pitting on left) and tenderness (left calf tenderness).       Left knee: She exhibits swelling (trace) and effusion (trace). She exhibits normal range of motion, no deformity, no laceration, no erythema, normal alignment, no LCL laxity, normal patellar mobility, no bony tenderness, normal meniscus and no MCL laxity. No tenderness found.  5/5 strength in left quads, hip flexors, dorsiflexion, plantar flexion without pain.  Good DP pulse.  Neurological: She is alert and oriented to person, place, and time. She exhibits normal muscle tone.  Skin: Skin is warm and dry. No rash noted. She is not diaphoretic. No  erythema.  Psychiatric: She has a normal mood and affect. Thought content normal.    ED Course  Procedures (including critical care time)  Labs Reviewed - No data to display No results found.   No diagnosis found.    MDM  79 yo F with h/o DVT in 2008 presenting with left knee pain, found to have calf tenderness and increased swelling of left leg on exam.  With calf tenderness and swelling of left leg (although patient reports the left leg always swells) will check venous duplex for DVT.  DVT would be unlikely to cause intra-articular knee pain.  Knee pain may be arthritis flare, no signs of meniscal or ligamentous injury on exam.    10:30AM: Ultrasound shows a partial DVT in the popliteal vein on the left.  Discussed with patient and will start Xarelto, first dose to be given here, and discharge home.  She will follow up with her PCP next week.  Phebe Colla, MD 10/14/12 1040

## 2012-10-14 NOTE — ED Provider Notes (Signed)
I saw  the patient, reviewed the resident's note and I agree with the findings and plan.   .Face to face Exam:  General:  Awake HEENT:  Atraumatic Resp:  Normal effort Abd:  Nondistended Neuro:No focal weakness   Nelia Shi, MD 10/14/12 1924

## 2012-10-19 DIAGNOSIS — I82409 Acute embolism and thrombosis of unspecified deep veins of unspecified lower extremity: Secondary | ICD-10-CM | POA: Diagnosis not present

## 2012-10-19 DIAGNOSIS — H532 Diplopia: Secondary | ICD-10-CM | POA: Diagnosis not present

## 2012-10-19 DIAGNOSIS — Z79899 Other long term (current) drug therapy: Secondary | ICD-10-CM | POA: Diagnosis not present

## 2012-11-03 DIAGNOSIS — H532 Diplopia: Secondary | ICD-10-CM | POA: Diagnosis not present

## 2012-11-03 DIAGNOSIS — H4011X0 Primary open-angle glaucoma, stage unspecified: Secondary | ICD-10-CM | POA: Diagnosis not present

## 2012-11-18 DIAGNOSIS — Z79899 Other long term (current) drug therapy: Secondary | ICD-10-CM | POA: Diagnosis not present

## 2012-11-18 DIAGNOSIS — I82409 Acute embolism and thrombosis of unspecified deep veins of unspecified lower extremity: Secondary | ICD-10-CM | POA: Diagnosis not present

## 2013-01-17 DIAGNOSIS — R809 Proteinuria, unspecified: Secondary | ICD-10-CM | POA: Diagnosis not present

## 2013-01-17 DIAGNOSIS — Z79899 Other long term (current) drug therapy: Secondary | ICD-10-CM | POA: Diagnosis not present

## 2013-01-17 DIAGNOSIS — I10 Essential (primary) hypertension: Secondary | ICD-10-CM | POA: Diagnosis not present

## 2013-01-17 DIAGNOSIS — M810 Age-related osteoporosis without current pathological fracture: Secondary | ICD-10-CM | POA: Diagnosis not present

## 2013-01-17 DIAGNOSIS — E039 Hypothyroidism, unspecified: Secondary | ICD-10-CM | POA: Diagnosis not present

## 2013-01-17 DIAGNOSIS — I82409 Acute embolism and thrombosis of unspecified deep veins of unspecified lower extremity: Secondary | ICD-10-CM | POA: Diagnosis not present

## 2013-01-26 DIAGNOSIS — H353190 Nonexudative age-related macular degeneration, unspecified eye, stage unspecified: Secondary | ICD-10-CM | POA: Diagnosis not present

## 2013-01-26 DIAGNOSIS — H4011X0 Primary open-angle glaucoma, stage unspecified: Secondary | ICD-10-CM | POA: Diagnosis not present

## 2013-02-01 DIAGNOSIS — M25569 Pain in unspecified knee: Secondary | ICD-10-CM | POA: Diagnosis not present

## 2013-03-07 DIAGNOSIS — H4010X0 Unspecified open-angle glaucoma, stage unspecified: Secondary | ICD-10-CM | POA: Diagnosis not present

## 2013-03-10 ENCOUNTER — Other Ambulatory Visit: Payer: Self-pay | Admitting: Family Medicine

## 2013-03-10 DIAGNOSIS — Z86718 Personal history of other venous thrombosis and embolism: Secondary | ICD-10-CM

## 2013-03-15 ENCOUNTER — Ambulatory Visit
Admission: RE | Admit: 2013-03-15 | Discharge: 2013-03-15 | Disposition: A | Discharge status: Home / Self Care | Payer: Medicare Other | Source: Ambulatory Visit | Attending: Family Medicine | Admitting: Family Medicine

## 2013-03-15 DIAGNOSIS — Z86718 Personal history of other venous thrombosis and embolism: Secondary | ICD-10-CM

## 2013-03-15 DIAGNOSIS — I824Y9 Acute embolism and thrombosis of unspecified deep veins of unspecified proximal lower extremity: Secondary | ICD-10-CM | POA: Diagnosis not present

## 2013-04-03 DIAGNOSIS — M25569 Pain in unspecified knee: Secondary | ICD-10-CM | POA: Diagnosis not present

## 2013-04-06 DIAGNOSIS — H4011X0 Primary open-angle glaucoma, stage unspecified: Secondary | ICD-10-CM | POA: Diagnosis not present

## 2013-04-18 DIAGNOSIS — Z23 Encounter for immunization: Secondary | ICD-10-CM | POA: Diagnosis not present

## 2013-07-19 DIAGNOSIS — M25549 Pain in joints of unspecified hand: Secondary | ICD-10-CM | POA: Diagnosis not present

## 2013-07-19 DIAGNOSIS — M19049 Primary osteoarthritis, unspecified hand: Secondary | ICD-10-CM | POA: Diagnosis not present

## 2013-07-19 DIAGNOSIS — H409 Unspecified glaucoma: Secondary | ICD-10-CM | POA: Diagnosis not present

## 2013-07-19 DIAGNOSIS — E039 Hypothyroidism, unspecified: Secondary | ICD-10-CM | POA: Diagnosis not present

## 2013-07-19 DIAGNOSIS — I10 Essential (primary) hypertension: Secondary | ICD-10-CM | POA: Diagnosis not present

## 2013-07-19 DIAGNOSIS — M810 Age-related osteoporosis without current pathological fracture: Secondary | ICD-10-CM | POA: Diagnosis not present

## 2013-07-19 DIAGNOSIS — I82409 Acute embolism and thrombosis of unspecified deep veins of unspecified lower extremity: Secondary | ICD-10-CM | POA: Diagnosis not present

## 2013-07-19 DIAGNOSIS — N183 Chronic kidney disease, stage 3 unspecified: Secondary | ICD-10-CM | POA: Diagnosis not present

## 2013-07-19 DIAGNOSIS — H3530 Unspecified macular degeneration: Secondary | ICD-10-CM | POA: Diagnosis not present

## 2013-07-21 DIAGNOSIS — H4011X0 Primary open-angle glaucoma, stage unspecified: Secondary | ICD-10-CM | POA: Diagnosis not present

## 2014-01-01 DIAGNOSIS — H4011X0 Primary open-angle glaucoma, stage unspecified: Secondary | ICD-10-CM | POA: Diagnosis not present

## 2014-01-19 DIAGNOSIS — M810 Age-related osteoporosis without current pathological fracture: Secondary | ICD-10-CM | POA: Diagnosis not present

## 2014-01-19 DIAGNOSIS — H3530 Unspecified macular degeneration: Secondary | ICD-10-CM | POA: Diagnosis not present

## 2014-01-19 DIAGNOSIS — I10 Essential (primary) hypertension: Secondary | ICD-10-CM | POA: Diagnosis not present

## 2014-01-19 DIAGNOSIS — N3941 Urge incontinence: Secondary | ICD-10-CM | POA: Diagnosis not present

## 2014-01-19 DIAGNOSIS — Z23 Encounter for immunization: Secondary | ICD-10-CM | POA: Diagnosis not present

## 2014-01-19 DIAGNOSIS — N183 Chronic kidney disease, stage 3 unspecified: Secondary | ICD-10-CM | POA: Diagnosis not present

## 2014-01-19 DIAGNOSIS — E039 Hypothyroidism, unspecified: Secondary | ICD-10-CM | POA: Diagnosis not present

## 2014-01-30 DIAGNOSIS — H4011X0 Primary open-angle glaucoma, stage unspecified: Secondary | ICD-10-CM | POA: Diagnosis not present

## 2014-01-30 DIAGNOSIS — H409 Unspecified glaucoma: Secondary | ICD-10-CM | POA: Diagnosis not present

## 2014-03-08 ENCOUNTER — Encounter: Payer: Self-pay | Admitting: *Deleted

## 2014-04-06 DIAGNOSIS — H4011X3 Primary open-angle glaucoma, severe stage: Secondary | ICD-10-CM | POA: Diagnosis not present

## 2014-04-06 DIAGNOSIS — H4011X2 Primary open-angle glaucoma, moderate stage: Secondary | ICD-10-CM | POA: Diagnosis not present

## 2014-04-06 DIAGNOSIS — H3530 Unspecified macular degeneration: Secondary | ICD-10-CM | POA: Diagnosis not present

## 2014-04-06 DIAGNOSIS — I10 Essential (primary) hypertension: Secondary | ICD-10-CM | POA: Diagnosis not present

## 2014-05-04 DIAGNOSIS — L821 Other seborrheic keratosis: Secondary | ICD-10-CM | POA: Diagnosis not present

## 2014-05-09 DIAGNOSIS — H4011X2 Primary open-angle glaucoma, moderate stage: Secondary | ICD-10-CM | POA: Diagnosis not present

## 2014-05-09 DIAGNOSIS — H4011X3 Primary open-angle glaucoma, severe stage: Secondary | ICD-10-CM | POA: Diagnosis not present

## 2014-07-16 ENCOUNTER — Emergency Department (HOSPITAL_COMMUNITY): Payer: Medicare Other

## 2014-07-16 ENCOUNTER — Encounter (HOSPITAL_COMMUNITY): Payer: Self-pay | Admitting: Emergency Medicine

## 2014-07-16 ENCOUNTER — Emergency Department (HOSPITAL_COMMUNITY)
Admission: EM | Admit: 2014-07-16 | Discharge: 2014-07-16 | Disposition: A | Discharge status: Home / Self Care | Payer: Medicare Other | Attending: Emergency Medicine | Admitting: Emergency Medicine

## 2014-07-16 DIAGNOSIS — S2232XA Fracture of one rib, left side, initial encounter for closed fracture: Secondary | ICD-10-CM | POA: Diagnosis not present

## 2014-07-16 DIAGNOSIS — Y9289 Other specified places as the place of occurrence of the external cause: Secondary | ICD-10-CM | POA: Diagnosis not present

## 2014-07-16 DIAGNOSIS — W1839XA Other fall on same level, initial encounter: Secondary | ICD-10-CM | POA: Insufficient documentation

## 2014-07-16 DIAGNOSIS — E039 Hypothyroidism, unspecified: Secondary | ICD-10-CM | POA: Insufficient documentation

## 2014-07-16 DIAGNOSIS — Y998 Other external cause status: Secondary | ICD-10-CM | POA: Diagnosis not present

## 2014-07-16 DIAGNOSIS — R079 Chest pain, unspecified: Secondary | ICD-10-CM | POA: Diagnosis not present

## 2014-07-16 DIAGNOSIS — Y9389 Activity, other specified: Secondary | ICD-10-CM | POA: Diagnosis not present

## 2014-07-16 DIAGNOSIS — S299XXA Unspecified injury of thorax, initial encounter: Secondary | ICD-10-CM | POA: Diagnosis present

## 2014-07-16 DIAGNOSIS — Z79899 Other long term (current) drug therapy: Secondary | ICD-10-CM | POA: Insufficient documentation

## 2014-07-16 DIAGNOSIS — S279XXA Injury of unspecified intrathoracic organ, initial encounter: Secondary | ICD-10-CM | POA: Diagnosis not present

## 2014-07-16 DIAGNOSIS — Z86718 Personal history of other venous thrombosis and embolism: Secondary | ICD-10-CM | POA: Diagnosis not present

## 2014-07-16 DIAGNOSIS — I10 Essential (primary) hypertension: Secondary | ICD-10-CM | POA: Diagnosis not present

## 2014-07-16 DIAGNOSIS — W19XXXA Unspecified fall, initial encounter: Secondary | ICD-10-CM

## 2014-07-16 MED ORDER — OXYCODONE-ACETAMINOPHEN 5-325 MG PO TABS
2.0000 | ORAL_TABLET | Freq: Once | ORAL | Status: AC
  Administered 2014-07-16: 2 via ORAL
  Filled 2014-07-16: qty 2

## 2014-07-16 MED ORDER — OXYCODONE-ACETAMINOPHEN 5-325 MG PO TABS: 1.0000 | ORAL_TABLET | ORAL | Status: AC | PRN

## 2014-07-16 MED ORDER — DOCUSATE SODIUM 100 MG PO CAPS
100.0000 mg | ORAL_CAPSULE | Freq: Two times a day (BID) | ORAL | Status: AC | PRN
Start: 2014-07-16 — End: ?

## 2014-07-16 MED ORDER — IBUPROFEN 200 MG PO TABS
400.0000 mg | ORAL_TABLET | Freq: Once | ORAL | Status: AC
  Administered 2014-07-16: 400 mg via ORAL
  Filled 2014-07-16: qty 2

## 2014-07-16 NOTE — ED Notes (Signed)
Per EMS pt from home with complaint of fall resulting in left ribcage pain. Pt denies LOC or hit of head.

## 2014-07-16 NOTE — Discharge Instructions (Signed)

## 2014-07-16 NOTE — ED Notes (Signed)
Bed: WA07 Expected date:  Expected time:  Means of arrival:  Comments: Ems- 81 yo F, fall, now pain with deep inspiration

## 2014-07-16 NOTE — ED Provider Notes (Signed)
CSN: [id number]     Arrival date & time 07/16/14  6644 History   First MD Initiated Contact with Patient 07/16/14 [phone]     Chief Complaint  Patient presents with  . Fall     (Consider location/radiation/quality/duration/timing/severity/associated sxs/prior Treatment) HPI   81 year old female with left chest pain after fall. Patient lost her balance initially before arrival. She fell in her left side. Persistent pain in the left lateral chest and left mid back. Worse with movement and deep inspiration. No shortness of breath. Denies any significant pain anywhere else. Has been in Lipitor since her accident without any apparent difficulty. No blood thinners.   Past Medical History  Diagnosis Date  . Hypertension   . Thyroid disease   . Hypothyroid   . DVT (deep venous thrombosis)     pt states she has hx of dvt in 2008   Past Surgical History  Procedure Laterality Date  . Brain surgery     Family History  Problem Relation Age of Onset  . Heart disease Mother    History  Substance Use Topics  . Smoking status: Never Smoker   . Smokeless tobacco: Not on file  . Alcohol Use: No   OB History    No data available     Review of Systems  All systems reviewed and negative, other than as noted in HPI.   Allergies  Review of patient's allergies indicates no known allergies.  Home Medications   Prior to Admission medications   Medication Sig Start Date End Date Taking? Authorizing Provider  acetaminophen (TYLENOL) 650 MG CR tablet Take 1,300 mg by mouth 2 (two) times daily.   Yes Historical Provider, MD  amLODipine (NORVASC) 10 MG tablet Take 10 mg by mouth at bedtime.    Yes Historical Provider, MD  brimonidine (ALPHAGAN P) 0.1 % SOLN Place 1 drop into both eyes 2 (two) times daily.   Yes Historical Provider, MD  calcium carbonate (TITRALAC) 420 MG CHEW chewable tablet Chew 420 mg by mouth daily.   Yes Historical Provider, MD  Cholecalciferol (VITAMIN D) 2000 UNITS tablet  Take 2,000 Units by mouth daily.   Yes Historical Provider, MD  docusate sodium (COLACE) 100 MG capsule Take 100 mg by mouth daily as needed for mild constipation.   Yes Historical Provider, MD  dorzolamide-timolol (COSOPT) 22.3-6.8 MG/ML ophthalmic solution Place 1 drop into both eyes 2 (two) times daily.    Yes Historical Provider, MD  levothyroxine (SYNTHROID, LEVOTHROID) 88 MCG tablet Take 88 mcg by mouth daily before breakfast.    Yes Historical Provider, MD  Multiple Vitamins-Minerals (ICAPS PO) Take 1 tablet by mouth 2 (two) times daily.    Yes Historical Provider, MD  alendronate (FOSAMAX) 70 MG tablet Take 70 mg by mouth every 7 (seven) days. Take with a full glass of water on an empty stomach. Takes on Sunday.    Historical Provider, MD  bimatoprost (LUMIGAN) 0.01 % SOLN Place 1 drop into both eyes at bedtime.    Historical Provider, MD  Polyethyl Glycol-Propyl Glycol (SYSTANE OP) Place 1 drop into both eyes 3 (three) times daily.     Historical Provider, MD   BP 149/69 mmHg  Pulse 85  Temp(Src) 98.4 F (36.9 C) (Oral)  Resp 16  Ht 5\' 5"  (1.651 m)  Wt 120 lb (54.432 kg)  BMI 19.97 kg/m2  SpO2 95% Physical Exam  Constitutional: She appears well-developed and well-nourished. No distress.  HENT:  Head: Normocephalic and atraumatic.  Eyes:  Conjunctivae are normal. Right eye exhibits no discharge. Left eye exhibits no discharge.  Neck: Neck supple.  Cardiovascular: Normal rate, regular rhythm and normal heart sounds.  Exam reveals no gallop and no friction rub.   No murmur heard. Pulmonary/Chest: Effort normal and breath sounds normal. No respiratory distress.   She exhibits tenderness.  Abdominal: Soft. She exhibits no distension. There is no tenderness.  Musculoskeletal: She exhibits no edema or tenderness.  Neurological: She is alert.  Skin: Skin is warm and dry.  Psychiatric: She has a normal mood and affect. Her behavior is normal. Thought content normal.  Nursing note and  vitals reviewed.   ED Course  Procedures (including critical care time)  Definitve Fracture Care. Closed treatment of uncomplicated rib fracture: 1 (one):  Definitive fracture care was provided for a single closed rib fracture. Treatment including pain medication, discussion of possible complications including pneumonia, discussion of usage of incentive spirometry, and referral to primary care physician (our resource list provided) for further followup.  Labs Review Labs Reviewed - No data to display  Imaging Review Dg Ribs Unilateral W/chest Left  07/16/2014   CLINICAL DATA:  Initial encounter for fall this morning in bathroom at home with left lateral and posterior right chest pain, worse on inspiration.  EXAM: LEFT RIBS AND CHEST - 3+ VIEW  COMPARISON:  Chest x-ray from 05/25/2011  FINDINGS: Hyper expansion is consistent with emphysema. Biapical pleural parenchymal opacity is not substantially changed, compatible with scarring. Interstitial scarring is seen at the bases, left greater than right. No evidence for pneumothorax or pleural effusion.  Bones are diffusely demineralized. Oblique views of the left ribs were obtained with radiopaque BB, presumably localized in the area of patient concern. Oblique films show an apparent nondisplaced acute fracture of the posterior lateral left eleventh rib.  IMPRESSION: Nondisplaced left eleventh rib fracture.   Electronically Signed   By: Misty Stanley M.D.   On: 07/16/2014 09:14     EKG Interpretation None      MDM   Final diagnoses:  Fall  Closed rib fracture, left, initial encounter    81 year old female with left-sided chest pain after mechanical fall. Imaging significant for a single closed nondisplaced rib fracture. No acute respiratory complaints. The patient control. Stool softeners will taking pain medications. Incentive spirometry.    Virgel Manifold, MD 07/17/14 [phone]

## 2014-07-24 DIAGNOSIS — S2232XD Fracture of one rib, left side, subsequent encounter for fracture with routine healing: Secondary | ICD-10-CM | POA: Diagnosis not present

## 2014-07-24 DIAGNOSIS — K5900 Constipation, unspecified: Secondary | ICD-10-CM | POA: Diagnosis not present

## 2014-07-24 DIAGNOSIS — M810 Age-related osteoporosis without current pathological fracture: Secondary | ICD-10-CM | POA: Diagnosis not present

## 2014-07-24 DIAGNOSIS — I10 Essential (primary) hypertension: Secondary | ICD-10-CM | POA: Diagnosis not present

## 2014-07-24 DIAGNOSIS — N183 Chronic kidney disease, stage 3 (moderate): Secondary | ICD-10-CM | POA: Diagnosis not present

## 2014-07-24 DIAGNOSIS — H3530 Unspecified macular degeneration: Secondary | ICD-10-CM | POA: Diagnosis not present

## 2014-07-24 DIAGNOSIS — B353 Tinea pedis: Secondary | ICD-10-CM | POA: Diagnosis not present

## 2014-07-24 DIAGNOSIS — R7301 Impaired fasting glucose: Secondary | ICD-10-CM | POA: Diagnosis not present

## 2014-07-24 DIAGNOSIS — E039 Hypothyroidism, unspecified: Secondary | ICD-10-CM | POA: Diagnosis not present

## 2014-07-26 DIAGNOSIS — E039 Hypothyroidism, unspecified: Secondary | ICD-10-CM | POA: Diagnosis not present

## 2014-07-26 DIAGNOSIS — S2232XD Fracture of one rib, left side, subsequent encounter for fracture with routine healing: Secondary | ICD-10-CM | POA: Diagnosis not present

## 2014-07-26 DIAGNOSIS — H3530 Unspecified macular degeneration: Secondary | ICD-10-CM | POA: Diagnosis not present

## 2014-07-26 DIAGNOSIS — K5900 Constipation, unspecified: Secondary | ICD-10-CM | POA: Diagnosis not present

## 2014-07-26 DIAGNOSIS — Z9181 History of falling: Secondary | ICD-10-CM | POA: Diagnosis not present

## 2014-07-26 DIAGNOSIS — I129 Hypertensive chronic kidney disease with stage 1 through stage 4 chronic kidney disease, or unspecified chronic kidney disease: Secondary | ICD-10-CM | POA: Diagnosis not present

## 2014-07-26 DIAGNOSIS — N183 Chronic kidney disease, stage 3 (moderate): Secondary | ICD-10-CM | POA: Diagnosis not present

## 2014-07-26 DIAGNOSIS — M810 Age-related osteoporosis without current pathological fracture: Secondary | ICD-10-CM | POA: Diagnosis not present

## 2014-07-30 DIAGNOSIS — M810 Age-related osteoporosis without current pathological fracture: Secondary | ICD-10-CM | POA: Diagnosis not present

## 2014-07-30 DIAGNOSIS — H3530 Unspecified macular degeneration: Secondary | ICD-10-CM | POA: Diagnosis not present

## 2014-07-30 DIAGNOSIS — S2232XD Fracture of one rib, left side, subsequent encounter for fracture with routine healing: Secondary | ICD-10-CM | POA: Diagnosis not present

## 2014-07-30 DIAGNOSIS — N183 Chronic kidney disease, stage 3 (moderate): Secondary | ICD-10-CM | POA: Diagnosis not present

## 2014-07-30 DIAGNOSIS — E039 Hypothyroidism, unspecified: Secondary | ICD-10-CM | POA: Diagnosis not present

## 2014-07-30 DIAGNOSIS — I129 Hypertensive chronic kidney disease with stage 1 through stage 4 chronic kidney disease, or unspecified chronic kidney disease: Secondary | ICD-10-CM | POA: Diagnosis not present

## 2014-07-31 DIAGNOSIS — M810 Age-related osteoporosis without current pathological fracture: Secondary | ICD-10-CM | POA: Diagnosis not present

## 2014-07-31 DIAGNOSIS — S2232XD Fracture of one rib, left side, subsequent encounter for fracture with routine healing: Secondary | ICD-10-CM | POA: Diagnosis not present

## 2014-07-31 DIAGNOSIS — H3530 Unspecified macular degeneration: Secondary | ICD-10-CM | POA: Diagnosis not present

## 2014-07-31 DIAGNOSIS — N183 Chronic kidney disease, stage 3 (moderate): Secondary | ICD-10-CM | POA: Diagnosis not present

## 2014-07-31 DIAGNOSIS — E039 Hypothyroidism, unspecified: Secondary | ICD-10-CM | POA: Diagnosis not present

## 2014-07-31 DIAGNOSIS — I129 Hypertensive chronic kidney disease with stage 1 through stage 4 chronic kidney disease, or unspecified chronic kidney disease: Secondary | ICD-10-CM | POA: Diagnosis not present

## 2014-08-02 DIAGNOSIS — H3530 Unspecified macular degeneration: Secondary | ICD-10-CM | POA: Diagnosis not present

## 2014-08-02 DIAGNOSIS — E039 Hypothyroidism, unspecified: Secondary | ICD-10-CM | POA: Diagnosis not present

## 2014-08-02 DIAGNOSIS — N183 Chronic kidney disease, stage 3 (moderate): Secondary | ICD-10-CM | POA: Diagnosis not present

## 2014-08-02 DIAGNOSIS — S2232XD Fracture of one rib, left side, subsequent encounter for fracture with routine healing: Secondary | ICD-10-CM | POA: Diagnosis not present

## 2014-08-02 DIAGNOSIS — I129 Hypertensive chronic kidney disease with stage 1 through stage 4 chronic kidney disease, or unspecified chronic kidney disease: Secondary | ICD-10-CM | POA: Diagnosis not present

## 2014-08-02 DIAGNOSIS — M810 Age-related osteoporosis without current pathological fracture: Secondary | ICD-10-CM | POA: Diagnosis not present

## 2014-08-03 DIAGNOSIS — S2232XD Fracture of one rib, left side, subsequent encounter for fracture with routine healing: Secondary | ICD-10-CM | POA: Diagnosis not present

## 2014-08-03 DIAGNOSIS — N183 Chronic kidney disease, stage 3 (moderate): Secondary | ICD-10-CM | POA: Diagnosis not present

## 2014-08-03 DIAGNOSIS — M810 Age-related osteoporosis without current pathological fracture: Secondary | ICD-10-CM | POA: Diagnosis not present

## 2014-08-03 DIAGNOSIS — I129 Hypertensive chronic kidney disease with stage 1 through stage 4 chronic kidney disease, or unspecified chronic kidney disease: Secondary | ICD-10-CM | POA: Diagnosis not present

## 2014-08-03 DIAGNOSIS — H3530 Unspecified macular degeneration: Secondary | ICD-10-CM | POA: Diagnosis not present

## 2014-08-03 DIAGNOSIS — E039 Hypothyroidism, unspecified: Secondary | ICD-10-CM | POA: Diagnosis not present

## 2014-08-06 DIAGNOSIS — M810 Age-related osteoporosis without current pathological fracture: Secondary | ICD-10-CM | POA: Diagnosis not present

## 2014-08-06 DIAGNOSIS — I129 Hypertensive chronic kidney disease with stage 1 through stage 4 chronic kidney disease, or unspecified chronic kidney disease: Secondary | ICD-10-CM | POA: Diagnosis not present

## 2014-08-06 DIAGNOSIS — H3530 Unspecified macular degeneration: Secondary | ICD-10-CM | POA: Diagnosis not present

## 2014-08-06 DIAGNOSIS — N183 Chronic kidney disease, stage 3 (moderate): Secondary | ICD-10-CM | POA: Diagnosis not present

## 2014-08-06 DIAGNOSIS — S2232XD Fracture of one rib, left side, subsequent encounter for fracture with routine healing: Secondary | ICD-10-CM | POA: Diagnosis not present

## 2014-08-06 DIAGNOSIS — E039 Hypothyroidism, unspecified: Secondary | ICD-10-CM | POA: Diagnosis not present

## 2014-08-07 DIAGNOSIS — M810 Age-related osteoporosis without current pathological fracture: Secondary | ICD-10-CM | POA: Diagnosis not present

## 2014-08-07 DIAGNOSIS — N183 Chronic kidney disease, stage 3 (moderate): Secondary | ICD-10-CM | POA: Diagnosis not present

## 2014-08-07 DIAGNOSIS — S2232XD Fracture of one rib, left side, subsequent encounter for fracture with routine healing: Secondary | ICD-10-CM | POA: Diagnosis not present

## 2014-08-07 DIAGNOSIS — H3530 Unspecified macular degeneration: Secondary | ICD-10-CM | POA: Diagnosis not present

## 2014-08-07 DIAGNOSIS — E039 Hypothyroidism, unspecified: Secondary | ICD-10-CM | POA: Diagnosis not present

## 2014-08-07 DIAGNOSIS — I129 Hypertensive chronic kidney disease with stage 1 through stage 4 chronic kidney disease, or unspecified chronic kidney disease: Secondary | ICD-10-CM | POA: Diagnosis not present

## 2014-08-08 DIAGNOSIS — I129 Hypertensive chronic kidney disease with stage 1 through stage 4 chronic kidney disease, or unspecified chronic kidney disease: Secondary | ICD-10-CM | POA: Diagnosis not present

## 2014-08-08 DIAGNOSIS — E039 Hypothyroidism, unspecified: Secondary | ICD-10-CM | POA: Diagnosis not present

## 2014-08-08 DIAGNOSIS — N183 Chronic kidney disease, stage 3 (moderate): Secondary | ICD-10-CM | POA: Diagnosis not present

## 2014-08-08 DIAGNOSIS — H3530 Unspecified macular degeneration: Secondary | ICD-10-CM | POA: Diagnosis not present

## 2014-08-08 DIAGNOSIS — M810 Age-related osteoporosis without current pathological fracture: Secondary | ICD-10-CM | POA: Diagnosis not present

## 2014-08-08 DIAGNOSIS — S2232XD Fracture of one rib, left side, subsequent encounter for fracture with routine healing: Secondary | ICD-10-CM | POA: Diagnosis not present

## 2014-08-09 DIAGNOSIS — S2232XD Fracture of one rib, left side, subsequent encounter for fracture with routine healing: Secondary | ICD-10-CM | POA: Diagnosis not present

## 2014-08-09 DIAGNOSIS — I129 Hypertensive chronic kidney disease with stage 1 through stage 4 chronic kidney disease, or unspecified chronic kidney disease: Secondary | ICD-10-CM | POA: Diagnosis not present

## 2014-08-09 DIAGNOSIS — N183 Chronic kidney disease, stage 3 (moderate): Secondary | ICD-10-CM | POA: Diagnosis not present

## 2014-08-09 DIAGNOSIS — H3530 Unspecified macular degeneration: Secondary | ICD-10-CM | POA: Diagnosis not present

## 2014-08-09 DIAGNOSIS — M810 Age-related osteoporosis without current pathological fracture: Secondary | ICD-10-CM | POA: Diagnosis not present

## 2014-08-09 DIAGNOSIS — E039 Hypothyroidism, unspecified: Secondary | ICD-10-CM | POA: Diagnosis not present

## 2014-08-13 DIAGNOSIS — N183 Chronic kidney disease, stage 3 (moderate): Secondary | ICD-10-CM | POA: Diagnosis not present

## 2014-08-13 DIAGNOSIS — S2232XD Fracture of one rib, left side, subsequent encounter for fracture with routine healing: Secondary | ICD-10-CM | POA: Diagnosis not present

## 2014-08-13 DIAGNOSIS — E039 Hypothyroidism, unspecified: Secondary | ICD-10-CM | POA: Diagnosis not present

## 2014-08-13 DIAGNOSIS — M810 Age-related osteoporosis without current pathological fracture: Secondary | ICD-10-CM | POA: Diagnosis not present

## 2014-08-13 DIAGNOSIS — H3530 Unspecified macular degeneration: Secondary | ICD-10-CM | POA: Diagnosis not present

## 2014-08-13 DIAGNOSIS — I129 Hypertensive chronic kidney disease with stage 1 through stage 4 chronic kidney disease, or unspecified chronic kidney disease: Secondary | ICD-10-CM | POA: Diagnosis not present

## 2014-08-15 DIAGNOSIS — M810 Age-related osteoporosis without current pathological fracture: Secondary | ICD-10-CM | POA: Diagnosis not present

## 2014-08-15 DIAGNOSIS — H3530 Unspecified macular degeneration: Secondary | ICD-10-CM | POA: Diagnosis not present

## 2014-08-15 DIAGNOSIS — N183 Chronic kidney disease, stage 3 (moderate): Secondary | ICD-10-CM | POA: Diagnosis not present

## 2014-08-15 DIAGNOSIS — I129 Hypertensive chronic kidney disease with stage 1 through stage 4 chronic kidney disease, or unspecified chronic kidney disease: Secondary | ICD-10-CM | POA: Diagnosis not present

## 2014-08-15 DIAGNOSIS — S2232XD Fracture of one rib, left side, subsequent encounter for fracture with routine healing: Secondary | ICD-10-CM | POA: Diagnosis not present

## 2014-08-15 DIAGNOSIS — E039 Hypothyroidism, unspecified: Secondary | ICD-10-CM | POA: Diagnosis not present

## 2014-08-22 DIAGNOSIS — H3530 Unspecified macular degeneration: Secondary | ICD-10-CM | POA: Diagnosis not present

## 2014-08-22 DIAGNOSIS — M810 Age-related osteoporosis without current pathological fracture: Secondary | ICD-10-CM | POA: Diagnosis not present

## 2014-08-22 DIAGNOSIS — S2232XD Fracture of one rib, left side, subsequent encounter for fracture with routine healing: Secondary | ICD-10-CM | POA: Diagnosis not present

## 2014-08-22 DIAGNOSIS — I129 Hypertensive chronic kidney disease with stage 1 through stage 4 chronic kidney disease, or unspecified chronic kidney disease: Secondary | ICD-10-CM | POA: Diagnosis not present

## 2014-08-22 DIAGNOSIS — E039 Hypothyroidism, unspecified: Secondary | ICD-10-CM | POA: Diagnosis not present

## 2014-08-22 DIAGNOSIS — N183 Chronic kidney disease, stage 3 (moderate): Secondary | ICD-10-CM | POA: Diagnosis not present

## 2014-08-23 DIAGNOSIS — I129 Hypertensive chronic kidney disease with stage 1 through stage 4 chronic kidney disease, or unspecified chronic kidney disease: Secondary | ICD-10-CM | POA: Diagnosis not present

## 2014-08-23 DIAGNOSIS — E039 Hypothyroidism, unspecified: Secondary | ICD-10-CM | POA: Diagnosis not present

## 2014-08-23 DIAGNOSIS — H3530 Unspecified macular degeneration: Secondary | ICD-10-CM | POA: Diagnosis not present

## 2014-08-23 DIAGNOSIS — M810 Age-related osteoporosis without current pathological fracture: Secondary | ICD-10-CM | POA: Diagnosis not present

## 2014-08-23 DIAGNOSIS — N183 Chronic kidney disease, stage 3 (moderate): Secondary | ICD-10-CM | POA: Diagnosis not present

## 2014-08-23 DIAGNOSIS — S2232XD Fracture of one rib, left side, subsequent encounter for fracture with routine healing: Secondary | ICD-10-CM | POA: Diagnosis not present

## 2014-08-29 DIAGNOSIS — N183 Chronic kidney disease, stage 3 (moderate): Secondary | ICD-10-CM | POA: Diagnosis not present

## 2014-08-29 DIAGNOSIS — E039 Hypothyroidism, unspecified: Secondary | ICD-10-CM | POA: Diagnosis not present

## 2014-08-29 DIAGNOSIS — I129 Hypertensive chronic kidney disease with stage 1 through stage 4 chronic kidney disease, or unspecified chronic kidney disease: Secondary | ICD-10-CM | POA: Diagnosis not present

## 2014-08-29 DIAGNOSIS — M810 Age-related osteoporosis without current pathological fracture: Secondary | ICD-10-CM | POA: Diagnosis not present

## 2014-08-29 DIAGNOSIS — S2232XD Fracture of one rib, left side, subsequent encounter for fracture with routine healing: Secondary | ICD-10-CM | POA: Diagnosis not present

## 2014-08-29 DIAGNOSIS — H3530 Unspecified macular degeneration: Secondary | ICD-10-CM | POA: Diagnosis not present

## 2014-08-30 DIAGNOSIS — S2232XD Fracture of one rib, left side, subsequent encounter for fracture with routine healing: Secondary | ICD-10-CM | POA: Diagnosis not present

## 2014-08-30 DIAGNOSIS — E039 Hypothyroidism, unspecified: Secondary | ICD-10-CM | POA: Diagnosis not present

## 2014-08-30 DIAGNOSIS — M810 Age-related osteoporosis without current pathological fracture: Secondary | ICD-10-CM | POA: Diagnosis not present

## 2014-08-30 DIAGNOSIS — H3530 Unspecified macular degeneration: Secondary | ICD-10-CM | POA: Diagnosis not present

## 2014-08-30 DIAGNOSIS — N183 Chronic kidney disease, stage 3 (moderate): Secondary | ICD-10-CM | POA: Diagnosis not present

## 2014-08-30 DIAGNOSIS — I129 Hypertensive chronic kidney disease with stage 1 through stage 4 chronic kidney disease, or unspecified chronic kidney disease: Secondary | ICD-10-CM | POA: Diagnosis not present

## 2014-08-31 DIAGNOSIS — S2232XD Fracture of one rib, left side, subsequent encounter for fracture with routine healing: Secondary | ICD-10-CM | POA: Diagnosis not present

## 2014-08-31 DIAGNOSIS — H3530 Unspecified macular degeneration: Secondary | ICD-10-CM | POA: Diagnosis not present

## 2014-08-31 DIAGNOSIS — M810 Age-related osteoporosis without current pathological fracture: Secondary | ICD-10-CM | POA: Diagnosis not present

## 2014-08-31 DIAGNOSIS — E039 Hypothyroidism, unspecified: Secondary | ICD-10-CM | POA: Diagnosis not present

## 2014-08-31 DIAGNOSIS — N183 Chronic kidney disease, stage 3 (moderate): Secondary | ICD-10-CM | POA: Diagnosis not present

## 2014-08-31 DIAGNOSIS — I129 Hypertensive chronic kidney disease with stage 1 through stage 4 chronic kidney disease, or unspecified chronic kidney disease: Secondary | ICD-10-CM | POA: Diagnosis not present

## 2014-09-03 DIAGNOSIS — S2232XD Fracture of one rib, left side, subsequent encounter for fracture with routine healing: Secondary | ICD-10-CM | POA: Diagnosis not present

## 2014-09-03 DIAGNOSIS — I129 Hypertensive chronic kidney disease with stage 1 through stage 4 chronic kidney disease, or unspecified chronic kidney disease: Secondary | ICD-10-CM | POA: Diagnosis not present

## 2014-09-03 DIAGNOSIS — N183 Chronic kidney disease, stage 3 (moderate): Secondary | ICD-10-CM | POA: Diagnosis not present

## 2014-09-03 DIAGNOSIS — E039 Hypothyroidism, unspecified: Secondary | ICD-10-CM | POA: Diagnosis not present

## 2014-09-03 DIAGNOSIS — M810 Age-related osteoporosis without current pathological fracture: Secondary | ICD-10-CM | POA: Diagnosis not present

## 2014-09-03 DIAGNOSIS — H3530 Unspecified macular degeneration: Secondary | ICD-10-CM | POA: Diagnosis not present

## 2014-09-05 DIAGNOSIS — H3530 Unspecified macular degeneration: Secondary | ICD-10-CM | POA: Diagnosis not present

## 2014-09-05 DIAGNOSIS — N183 Chronic kidney disease, stage 3 (moderate): Secondary | ICD-10-CM | POA: Diagnosis not present

## 2014-09-05 DIAGNOSIS — E039 Hypothyroidism, unspecified: Secondary | ICD-10-CM | POA: Diagnosis not present

## 2014-09-05 DIAGNOSIS — M810 Age-related osteoporosis without current pathological fracture: Secondary | ICD-10-CM | POA: Diagnosis not present

## 2014-09-05 DIAGNOSIS — S2232XD Fracture of one rib, left side, subsequent encounter for fracture with routine healing: Secondary | ICD-10-CM | POA: Diagnosis not present

## 2014-09-05 DIAGNOSIS — I129 Hypertensive chronic kidney disease with stage 1 through stage 4 chronic kidney disease, or unspecified chronic kidney disease: Secondary | ICD-10-CM | POA: Diagnosis not present

## 2014-09-19 DIAGNOSIS — S2232XD Fracture of one rib, left side, subsequent encounter for fracture with routine healing: Secondary | ICD-10-CM | POA: Diagnosis not present

## 2014-09-19 DIAGNOSIS — H3530 Unspecified macular degeneration: Secondary | ICD-10-CM | POA: Diagnosis not present

## 2014-09-19 DIAGNOSIS — N183 Chronic kidney disease, stage 3 (moderate): Secondary | ICD-10-CM | POA: Diagnosis not present

## 2014-09-19 DIAGNOSIS — M810 Age-related osteoporosis without current pathological fracture: Secondary | ICD-10-CM | POA: Diagnosis not present

## 2014-09-19 DIAGNOSIS — E039 Hypothyroidism, unspecified: Secondary | ICD-10-CM | POA: Diagnosis not present

## 2014-09-19 DIAGNOSIS — I129 Hypertensive chronic kidney disease with stage 1 through stage 4 chronic kidney disease, or unspecified chronic kidney disease: Secondary | ICD-10-CM | POA: Diagnosis not present

## 2014-10-18 DIAGNOSIS — R296 Repeated falls: Secondary | ICD-10-CM | POA: Diagnosis not present

## 2014-10-18 DIAGNOSIS — E441 Mild protein-calorie malnutrition: Secondary | ICD-10-CM | POA: Diagnosis not present

## 2014-10-18 DIAGNOSIS — B351 Tinea unguium: Secondary | ICD-10-CM | POA: Diagnosis not present

## 2014-10-18 DIAGNOSIS — K649 Unspecified hemorrhoids: Secondary | ICD-10-CM | POA: Diagnosis not present

## 2014-10-18 DIAGNOSIS — R634 Abnormal weight loss: Secondary | ICD-10-CM | POA: Diagnosis not present

## 2014-10-23 DIAGNOSIS — M2041 Other hammer toe(s) (acquired), right foot: Secondary | ICD-10-CM | POA: Diagnosis not present

## 2014-10-23 DIAGNOSIS — L602 Onychogryphosis: Secondary | ICD-10-CM | POA: Diagnosis not present

## 2014-10-23 DIAGNOSIS — M2042 Other hammer toe(s) (acquired), left foot: Secondary | ICD-10-CM | POA: Diagnosis not present

## 2015-01-18 DIAGNOSIS — I10 Essential (primary) hypertension: Secondary | ICD-10-CM | POA: Diagnosis not present

## 2015-01-18 DIAGNOSIS — N183 Chronic kidney disease, stage 3 (moderate): Secondary | ICD-10-CM | POA: Diagnosis not present

## 2015-01-18 DIAGNOSIS — Z1389 Encounter for screening for other disorder: Secondary | ICD-10-CM | POA: Diagnosis not present

## 2015-01-18 DIAGNOSIS — M810 Age-related osteoporosis without current pathological fracture: Secondary | ICD-10-CM | POA: Diagnosis not present

## 2015-01-18 DIAGNOSIS — K5900 Constipation, unspecified: Secondary | ICD-10-CM | POA: Diagnosis not present

## 2015-01-18 DIAGNOSIS — E039 Hypothyroidism, unspecified: Secondary | ICD-10-CM | POA: Diagnosis not present

## 2015-01-18 DIAGNOSIS — N814 Uterovaginal prolapse, unspecified: Secondary | ICD-10-CM | POA: Diagnosis not present

## 2015-01-18 DIAGNOSIS — H3530 Unspecified macular degeneration: Secondary | ICD-10-CM | POA: Diagnosis not present

## 2015-01-18 DIAGNOSIS — R7301 Impaired fasting glucose: Secondary | ICD-10-CM | POA: Diagnosis not present

## 2015-06-10 DIAGNOSIS — H3530 Unspecified macular degeneration: Secondary | ICD-10-CM | POA: Diagnosis not present

## 2015-06-10 DIAGNOSIS — H00026 Hordeolum internum left eye, unspecified eyelid: Secondary | ICD-10-CM | POA: Diagnosis not present

## 2015-06-10 DIAGNOSIS — H00023 Hordeolum internum right eye, unspecified eyelid: Secondary | ICD-10-CM | POA: Diagnosis not present

## 2015-06-10 DIAGNOSIS — Z961 Presence of intraocular lens: Secondary | ICD-10-CM | POA: Diagnosis not present

## 2015-06-10 DIAGNOSIS — H401133 Primary open-angle glaucoma, bilateral, severe stage: Secondary | ICD-10-CM | POA: Diagnosis not present

## 2015-06-10 DIAGNOSIS — H401132 Primary open-angle glaucoma, bilateral, moderate stage: Secondary | ICD-10-CM | POA: Diagnosis not present

## 2015-06-18 DIAGNOSIS — F439 Reaction to severe stress, unspecified: Secondary | ICD-10-CM | POA: Diagnosis not present

## 2015-06-18 DIAGNOSIS — E039 Hypothyroidism, unspecified: Secondary | ICD-10-CM | POA: Diagnosis not present

## 2015-06-18 DIAGNOSIS — K5900 Constipation, unspecified: Secondary | ICD-10-CM | POA: Diagnosis not present

## 2015-06-18 DIAGNOSIS — H3530 Unspecified macular degeneration: Secondary | ICD-10-CM | POA: Diagnosis not present

## 2015-06-18 DIAGNOSIS — Z23 Encounter for immunization: Secondary | ICD-10-CM | POA: Diagnosis not present

## 2015-06-18 DIAGNOSIS — M810 Age-related osteoporosis without current pathological fracture: Secondary | ICD-10-CM | POA: Diagnosis not present

## 2015-06-18 DIAGNOSIS — I10 Essential (primary) hypertension: Secondary | ICD-10-CM | POA: Diagnosis not present

## 2015-06-18 DIAGNOSIS — N814 Uterovaginal prolapse, unspecified: Secondary | ICD-10-CM | POA: Diagnosis not present

## 2015-12-12 DIAGNOSIS — H11021 Central pterygium of right eye: Secondary | ICD-10-CM | POA: Diagnosis not present

## 2015-12-12 DIAGNOSIS — H01019 Ulcerative blepharitis unspecified eye, unspecified eyelid: Secondary | ICD-10-CM | POA: Diagnosis not present

## 2015-12-12 DIAGNOSIS — H11822 Conjunctivochalasis, left eye: Secondary | ICD-10-CM | POA: Diagnosis not present

## 2015-12-12 DIAGNOSIS — H3530 Unspecified macular degeneration: Secondary | ICD-10-CM | POA: Diagnosis not present

## 2015-12-17 DIAGNOSIS — I959 Hypotension, unspecified: Secondary | ICD-10-CM | POA: Diagnosis not present

## 2015-12-17 DIAGNOSIS — I10 Essential (primary) hypertension: Secondary | ICD-10-CM | POA: Diagnosis not present

## 2015-12-17 DIAGNOSIS — R296 Repeated falls: Secondary | ICD-10-CM | POA: Diagnosis not present

## 2015-12-17 DIAGNOSIS — R001 Bradycardia, unspecified: Secondary | ICD-10-CM | POA: Diagnosis not present

## 2015-12-17 DIAGNOSIS — N183 Chronic kidney disease, stage 3 (moderate): Secondary | ICD-10-CM | POA: Diagnosis not present

## 2015-12-17 DIAGNOSIS — M810 Age-related osteoporosis without current pathological fracture: Secondary | ICD-10-CM | POA: Diagnosis not present

## 2015-12-17 DIAGNOSIS — H3530 Unspecified macular degeneration: Secondary | ICD-10-CM | POA: Diagnosis not present

## 2015-12-17 DIAGNOSIS — E039 Hypothyroidism, unspecified: Secondary | ICD-10-CM | POA: Diagnosis not present

## 2015-12-17 DIAGNOSIS — Z86718 Personal history of other venous thrombosis and embolism: Secondary | ICD-10-CM | POA: Diagnosis not present

## 2016-01-17 DIAGNOSIS — R42 Dizziness and giddiness: Secondary | ICD-10-CM | POA: Diagnosis not present

## 2016-01-17 DIAGNOSIS — E039 Hypothyroidism, unspecified: Secondary | ICD-10-CM | POA: Diagnosis not present

## 2016-02-18 DIAGNOSIS — Z86718 Personal history of other venous thrombosis and embolism: Secondary | ICD-10-CM | POA: Diagnosis not present

## 2016-02-18 DIAGNOSIS — E039 Hypothyroidism, unspecified: Secondary | ICD-10-CM | POA: Diagnosis not present

## 2016-02-18 DIAGNOSIS — H3530 Unspecified macular degeneration: Secondary | ICD-10-CM | POA: Diagnosis not present

## 2016-02-18 DIAGNOSIS — N183 Chronic kidney disease, stage 3 (moderate): Secondary | ICD-10-CM | POA: Diagnosis not present

## 2016-02-18 DIAGNOSIS — I10 Essential (primary) hypertension: Secondary | ICD-10-CM | POA: Diagnosis not present

## 2016-02-18 DIAGNOSIS — R296 Repeated falls: Secondary | ICD-10-CM | POA: Diagnosis not present

## 2016-02-18 DIAGNOSIS — R001 Bradycardia, unspecified: Secondary | ICD-10-CM | POA: Diagnosis not present

## 2016-02-18 DIAGNOSIS — I959 Hypotension, unspecified: Secondary | ICD-10-CM | POA: Diagnosis not present

## 2016-02-18 DIAGNOSIS — M810 Age-related osteoporosis without current pathological fracture: Secondary | ICD-10-CM | POA: Diagnosis not present

## 2016-03-19 DIAGNOSIS — H401130 Primary open-angle glaucoma, bilateral, stage unspecified: Secondary | ICD-10-CM | POA: Diagnosis not present

## 2016-03-19 DIAGNOSIS — H353130 Nonexudative age-related macular degeneration, bilateral, stage unspecified: Secondary | ICD-10-CM | POA: Diagnosis not present

## 2016-04-23 DIAGNOSIS — L089 Local infection of the skin and subcutaneous tissue, unspecified: Secondary | ICD-10-CM | POA: Diagnosis not present

## 2016-04-23 DIAGNOSIS — Z23 Encounter for immunization: Secondary | ICD-10-CM | POA: Diagnosis not present

## 2016-05-11 DIAGNOSIS — K5900 Constipation, unspecified: Secondary | ICD-10-CM | POA: Diagnosis not present

## 2016-05-11 DIAGNOSIS — R300 Dysuria: Secondary | ICD-10-CM | POA: Diagnosis not present

## 2016-06-30 DIAGNOSIS — I10 Essential (primary) hypertension: Secondary | ICD-10-CM | POA: Diagnosis not present

## 2016-06-30 DIAGNOSIS — M810 Age-related osteoporosis without current pathological fracture: Secondary | ICD-10-CM | POA: Diagnosis not present

## 2016-06-30 DIAGNOSIS — E039 Hypothyroidism, unspecified: Secondary | ICD-10-CM | POA: Diagnosis not present

## 2016-07-02 DIAGNOSIS — K5900 Constipation, unspecified: Secondary | ICD-10-CM | POA: Diagnosis not present

## 2016-07-02 DIAGNOSIS — D492 Neoplasm of unspecified behavior of bone, soft tissue, and skin: Secondary | ICD-10-CM | POA: Diagnosis not present

## 2016-07-02 DIAGNOSIS — R300 Dysuria: Secondary | ICD-10-CM | POA: Diagnosis not present

## 2016-07-02 DIAGNOSIS — M810 Age-related osteoporosis without current pathological fracture: Secondary | ICD-10-CM | POA: Diagnosis not present

## 2016-07-02 DIAGNOSIS — I10 Essential (primary) hypertension: Secondary | ICD-10-CM | POA: Diagnosis not present

## 2016-07-02 DIAGNOSIS — E039 Hypothyroidism, unspecified: Secondary | ICD-10-CM | POA: Diagnosis not present

## 2016-07-02 DIAGNOSIS — H3530 Unspecified macular degeneration: Secondary | ICD-10-CM | POA: Diagnosis not present

## 2016-07-23 DIAGNOSIS — H401131 Primary open-angle glaucoma, bilateral, mild stage: Secondary | ICD-10-CM | POA: Diagnosis not present

## 2016-07-23 DIAGNOSIS — H353131 Nonexudative age-related macular degeneration, bilateral, early dry stage: Secondary | ICD-10-CM | POA: Diagnosis not present

## 2016-08-13 ENCOUNTER — Emergency Department (HOSPITAL_COMMUNITY)
Admission: EM | Admit: 2016-08-13 | Discharge: 2016-08-13 | Disposition: A | Discharge status: Home / Self Care | Payer: Medicare Other | Attending: Emergency Medicine | Admitting: Emergency Medicine

## 2016-08-13 ENCOUNTER — Emergency Department (HOSPITAL_BASED_OUTPATIENT_CLINIC_OR_DEPARTMENT_OTHER)
Admit: 2016-08-13 | Discharge: 2016-08-13 | Disposition: A | Discharge status: Home / Self Care | Payer: Medicare Other | Attending: Emergency Medicine | Admitting: Emergency Medicine

## 2016-08-13 ENCOUNTER — Emergency Department (HOSPITAL_COMMUNITY): Payer: Medicare Other

## 2016-08-13 ENCOUNTER — Encounter (HOSPITAL_COMMUNITY): Payer: Self-pay

## 2016-08-13 DIAGNOSIS — R0602 Shortness of breath: Secondary | ICD-10-CM | POA: Diagnosis not present

## 2016-08-13 DIAGNOSIS — R600 Localized edema: Secondary | ICD-10-CM | POA: Diagnosis not present

## 2016-08-13 DIAGNOSIS — R05 Cough: Secondary | ICD-10-CM | POA: Diagnosis not present

## 2016-08-13 DIAGNOSIS — R0689 Other abnormalities of breathing: Secondary | ICD-10-CM | POA: Insufficient documentation

## 2016-08-13 DIAGNOSIS — M79609 Pain in unspecified limb: Secondary | ICD-10-CM | POA: Diagnosis not present

## 2016-08-13 DIAGNOSIS — E039 Hypothyroidism, unspecified: Secondary | ICD-10-CM | POA: Insufficient documentation

## 2016-08-13 DIAGNOSIS — M7989 Other specified soft tissue disorders: Secondary | ICD-10-CM | POA: Diagnosis not present

## 2016-08-13 DIAGNOSIS — Z79899 Other long term (current) drug therapy: Secondary | ICD-10-CM | POA: Insufficient documentation

## 2016-08-13 DIAGNOSIS — I10 Essential (primary) hypertension: Secondary | ICD-10-CM | POA: Insufficient documentation

## 2016-08-13 DIAGNOSIS — R079 Chest pain, unspecified: Secondary | ICD-10-CM | POA: Diagnosis not present

## 2016-08-13 DIAGNOSIS — I509 Heart failure, unspecified: Secondary | ICD-10-CM | POA: Diagnosis not present

## 2016-08-13 LAB — COMPREHENSIVE METABOLIC PANEL
ALT: 18 U/L (ref 14–54)
AST: 22 U/L (ref 15–41)
Albumin: 3.8 g/dL (ref 3.5–5.0)
Alkaline Phosphatase: 74 U/L (ref 38–126)
Anion gap: 10 (ref 5–15)
BUN: 11 mg/dL (ref 6–20)
CO2: 28 mmol/L (ref 22–32)
Calcium: 9.3 mg/dL (ref 8.9–10.3)
Chloride: 98 mmol/L — ABNORMAL LOW (ref 101–111)
Creatinine, Ser: 0.77 mg/dL (ref 0.44–1.00)
GFR calc Af Amer: >60 mL/min (ref >60)
GFR calc non Af Amer: >60 mL/min (ref >60)
GLUCOSE: 104 mg/dL — AB (ref 65–99)
Potassium: 4 mmol/L (ref 3.5–5.1)
SODIUM: 136 mmol/L (ref 135–145)
Total Bilirubin: 0.7 mg/dL (ref 0.3–1.2)
Total Protein: 6.7 g/dL (ref 6.5–8.1)

## 2016-08-13 LAB — CBC WITH DIFFERENTIAL/PLATELET
BASOS ABS: 0 10*3/uL (ref 0.0–0.1)
Basophils Relative: 0 %
EOS ABS: 0.1 10*3/uL (ref 0.0–0.7)
Eosinophils Relative: 2 %
HCT: 40.2 % (ref 36.0–46.0)
HEMOGLOBIN: 12.8 g/dL (ref 12.0–15.0)
LYMPHS PCT: 30 %
Lymphs Abs: 1.9 10*3/uL (ref 0.7–4.0)
MCH: 29.4 pg (ref 26.0–34.0)
MCHC: 31.8 g/dL (ref 30.0–36.0)
MCV: 92.4 fL (ref 78.0–100.0)
Monocytes Absolute: 0.5 10*3/uL (ref 0.1–1.0)
Monocytes Relative: 8 %
NEUTROS PCT: 60 %
Neutro Abs: 3.8 10*3/uL (ref 1.7–7.7)
Platelets: 282 10*3/uL (ref 150–400)
RBC: 4.35 MIL/uL (ref 3.87–5.11)
RDW: 13.9 % (ref 11.5–15.5)
WBC: 6.3 10*3/uL (ref 4.0–10.5)

## 2016-08-13 LAB — BRAIN NATRIURETIC PEPTIDE: B Natriuretic Peptide: 214 pg/mL — ABNORMAL HIGH (ref 0.0–100.0)

## 2016-08-13 NOTE — ED Provider Notes (Signed)
[facility] DEPT Provider Note   CSN: [id number] Arrival date & time: 08/13/16  1355     History   Chief Complaint Chief Complaint  Patient presents with  . Leg Swelling    HPI [name] is a 83 y.o. female. Patient presents with bilateral leg pain and swelling. She is here accompanied by 2 nieces who are some of her caregivers. The family reports that the patient has had a problem with leg swelling in the past. She is also had a DVT several years ago, and is no longer on anticoagulation. The patient does have occasional leg swelling, but the family noted it was worse today. She also had some mild skin color changes to her left leg. The patient seemed to be having pain to palpation of both calves. They took her to the PCP who sent her here for further evaluation. Here, the patient reports that her swelling has improved. They did use compression stockings since being at the PCP. The family also reports the color changes have resolved. She denies any fever, chills, or shortness of breath.  The history is provided by the patient and a relative.    Past Medical History:  Diagnosis Date  . DVT (deep venous thrombosis) ([place])    pt states she has hx of dvt in 2008  . Hypertension   . Hypothyroid   . Thyroid disease     Patient Active Problem List   Diagnosis Date Noted  . Back pain 08/16/2011  . Sacral fracture, closed ([place]) 08/16/2011  . Urinary retention 08/16/2011  . Chest pain, unspecified 05/25/2011  . Leukocytosis 05/25/2011  . Hypertension   . Hypothyroid     Past Surgical History:  Procedure Laterality Date  . BRAIN SURGERY      OB History    No data available       Home Medications    Prior to Admission medications   Medication Sig Start Date End Date Taking? Authorizing Provider  acetaminophen (TYLENOL) 650 MG CR tablet Take 1,300 mg by mouth 2 (two) times daily as needed for pain.     Historical Provider, MD  amLODipine (NORVASC) 10 MG tablet  Take 10 mg by mouth at bedtime.     Historical Provider, MD  brimonidine (ALPHAGAN P) 0.1 % SOLN Place 1 drop into both eyes 2 (two) times daily.    Historical Provider, MD  calcium carbonate (TITRALAC) 420 MG CHEW chewable tablet Chew 420 mg by mouth daily.    Historical Provider, MD  Cholecalciferol (VITAMIN D) 2000 UNITS tablet Take 2,000 Units by mouth daily.    Historical Provider, MD  docusate sodium (COLACE) 100 MG capsule Take 100 mg by mouth daily as needed for mild constipation.    Historical Provider, MD  docusate sodium (COLACE) 100 MG capsule Take 1 capsule (100 mg total) by mouth 2 (two) times daily as needed for mild constipation. 07/16/14   Virgel Manifold, MD  dorzolamide-timolol (COSOPT) 22.3-6.8 MG/ML ophthalmic solution Place 1 drop into both eyes 2 (two) times daily.     Historical Provider, MD  levothyroxine (SYNTHROID, LEVOTHROID) 88 MCG tablet Take 88 mcg by mouth daily before breakfast.     Historical Provider, MD  Multiple Vitamins-Minerals (ICAPS PO) Take 1 tablet by mouth 2 (two) times daily.     Historical Provider, MD  oxyCODONE-acetaminophen (PERCOCET/ROXICET) 5-325 MG per tablet Take 1-2 tablets by mouth every 4 (four) hours as needed for severe pain. 07/16/14   Virgel Manifold, MD  Family History Family History  Problem Relation Age of Onset  . Heart disease Mother     Social History Social History  Substance Use Topics  . Smoking status: Never Smoker  . Smokeless tobacco: Never Used  . Alcohol use No     Allergies   Patient has no known allergies.   Review of Systems Review of Systems  Constitutional: Negative for chills and fever.  HENT: Negative for ear pain and sore throat.   Eyes: Negative for pain and visual disturbance.  Respiratory: Negative for cough and shortness of breath.   Cardiovascular: Positive for leg swelling. Negative for chest pain and palpitations.  Gastrointestinal: Negative for abdominal pain and vomiting.  Genitourinary:  Negative for dysuria and hematuria.  Musculoskeletal: Negative for arthralgias and back pain.  Skin: Negative for color change and rash.  Neurological: Negative for seizures and syncope.  All other systems reviewed and are negative.    Physical Exam Updated Vital Signs BP (!) 251/100 (BP Location: Right Arm)   Pulse 64   Temp 98.7 F (37.1 C) (Oral)   Resp 18   Ht 5\' 5"  (1.651 m)   Wt 54.4 kg   SpO2 95%   BMI 19.97 kg/m   Physical Exam  Constitutional: She appears well-developed and well-nourished. No distress.  HENT:  Head: Normocephalic and atraumatic.  Eyes: Conjunctivae are normal.  Neck: Neck supple.  Cardiovascular: Normal rate, regular rhythm and intact distal pulses.   No murmur heard. Pulmonary/Chest: Effort normal and breath sounds normal. No respiratory distress. She has no wheezes. She has no rales.  Abdominal: Soft. There is no tenderness.  Musculoskeletal: She exhibits edema (trace edema to bilateral feet).  Neurological: She is alert.  Skin: Skin is warm and dry.  Psychiatric: She has a normal mood and affect.  Nursing note and vitals reviewed.    ED Treatments / Results  Labs (all labs ordered are listed, but only abnormal results are displayed) Labs Reviewed  COMPREHENSIVE METABOLIC PANEL - Abnormal; Notable for the following:       Result Value   Chloride 98 (*)    Glucose, Bld 104 (*)    All other components within normal limits  BRAIN NATRIURETIC PEPTIDE - Abnormal; Notable for the following:    B Natriuretic Peptide 214.0 (*)    All other components within normal limits  CBC WITH DIFFERENTIAL/PLATELET    EKG  EKG Interpretation None       Radiology Dg Chest 2 View  Result Date: 08/13/2016 CLINICAL DATA:  2-3 days of bilateral lower extremity swelling greatest on the left. No shortness of breath or chest pain. Patient reports nonproductive cough. The patient experiences dizzy spells which are increasing. EXAM: CHEST  2 VIEW  COMPARISON:  Chest x-ray of July 16, 2014 FINDINGS: The lungs remain hyperinflated. There is persistent increased density at the left lung base compatible with scarring. There is no acute infiltrate. There is stable biapical pleural thickening. The heart is mildly enlarged but stable. The pulmonary vascularity is normal. There is calcification in the wall of the aortic arch. The bony structures are subjectively osteopenic. No acute bony abnormality is observed. IMPRESSION: Mild chronic bronchitic changes with left basilar scarring. No acute pneumonia nor pulmonary edema. Stable mild cardiomegaly. Thoracic aortic atherosclerosis. Electronically Signed   By: David  Martinique M.D.   On: 08/13/2016 15:51    Procedures Procedures (including critical care time)  Medications Ordered in ED Medications - No data to display   Initial Impression /  Assessment and Plan / ED Course  I have reviewed the triage vital signs and the nursing notes.  Pertinent labs & imaging results that were available during my care of the patient were reviewed by me and considered in my medical decision making (see chart for details).    Patient is a very pleasant 83 year old female with history as above who presents with pain and swelling to bilateral lower extremity. See history of present illness for full details. Patient's pain and swelling began earlier today. She saw her PCP who sent her here for further evaluation. She does have a history of a prior DVT, and is no longer on anticoagulation. The family reports that her swelling has resolved after they used compression stockings. She has mild bilateral trace edema to ankles. No history of heart failure. She has had no chest pain or shortness of breath. Bilateral lower tree ultrasound performed here which showed no DVT. Patient has no skin color changes here to suggest cellulitis. Distal pulses are intact in all extremity. Patient likely has dependent edema. This seemed to improve  with compression stockings. Plan is for conservative treatment. Of note, pt was hypertensive here although asymptomatic. She has hx of HTN but was taken off anti-HTN meds d/t low blood pressure. Plan is for patient to follow-up with PCP within 1 week. Patient discharged in stable condition.   Final Clinical Impressions(s) / ED Diagnoses   Final diagnoses:  Bilateral lower extremity edema    New Prescriptions New Prescriptions   No medications on file     Clifton James, MD 08/13/16 2101    Gwenyth Allegra Tegeler, MD 08/15/16 1118

## 2016-08-13 NOTE — ED Triage Notes (Signed)
Pt presents with 2-3 day h/o BLE swelling with L leg worse.  Pt denies any shortness of breath, reports dry cough, denies any chest pain.  Granddaughter reports pt has had "dizzy spells" that are worsening.  Pt seen at [facility] and referred here for DVT vs CHF.

## 2016-08-13 NOTE — Progress Notes (Signed)
VASCULAR LAB PRELIMINARY  PRELIMINARY  PRELIMINARY  PRELIMINARY  Bilateral lower extremity venous duplex completed.    Preliminary report: Bilateral:  No evidence of DVT, superficial thrombosis, or Baker's Cyst.   Elizebeth Kluesner, RVT 08/13/2016, 7:33 PM

## 2016-08-13 NOTE — ED Notes (Signed)
Patient transported to Ultrasound 

## 2016-08-14 DIAGNOSIS — Z86718 Personal history of other venous thrombosis and embolism: Secondary | ICD-10-CM | POA: Diagnosis not present

## 2016-08-14 DIAGNOSIS — E039 Hypothyroidism, unspecified: Secondary | ICD-10-CM | POA: Diagnosis not present

## 2016-08-14 DIAGNOSIS — I129 Hypertensive chronic kidney disease with stage 1 through stage 4 chronic kidney disease, or unspecified chronic kidney disease: Secondary | ICD-10-CM | POA: Diagnosis not present

## 2016-08-14 DIAGNOSIS — S0990XA Unspecified injury of head, initial encounter: Secondary | ICD-10-CM | POA: Diagnosis not present

## 2016-08-14 DIAGNOSIS — Z79899 Other long term (current) drug therapy: Secondary | ICD-10-CM | POA: Diagnosis not present

## 2016-08-14 DIAGNOSIS — S0003XA Contusion of scalp, initial encounter: Secondary | ICD-10-CM | POA: Diagnosis not present

## 2016-08-14 DIAGNOSIS — R51 Headache: Secondary | ICD-10-CM | POA: Diagnosis not present

## 2016-08-14 DIAGNOSIS — I160 Hypertensive urgency: Secondary | ICD-10-CM | POA: Diagnosis not present

## 2016-08-14 DIAGNOSIS — S0101XA Laceration without foreign body of scalp, initial encounter: Secondary | ICD-10-CM | POA: Diagnosis not present

## 2016-08-14 DIAGNOSIS — I10 Essential (primary) hypertension: Secondary | ICD-10-CM | POA: Diagnosis not present

## 2016-08-14 DIAGNOSIS — H3530 Unspecified macular degeneration: Secondary | ICD-10-CM | POA: Diagnosis not present

## 2016-08-14 DIAGNOSIS — N183 Chronic kidney disease, stage 3 (moderate): Secondary | ICD-10-CM | POA: Diagnosis not present

## 2016-12-14 DIAGNOSIS — D492 Neoplasm of unspecified behavior of bone, soft tissue, and skin: Secondary | ICD-10-CM | POA: Diagnosis not present

## 2016-12-14 DIAGNOSIS — M810 Age-related osteoporosis without current pathological fracture: Secondary | ICD-10-CM | POA: Diagnosis not present

## 2016-12-14 DIAGNOSIS — H3530 Unspecified macular degeneration: Secondary | ICD-10-CM | POA: Diagnosis not present

## 2016-12-14 DIAGNOSIS — E039 Hypothyroidism, unspecified: Secondary | ICD-10-CM | POA: Diagnosis not present

## 2016-12-14 DIAGNOSIS — R300 Dysuria: Secondary | ICD-10-CM | POA: Diagnosis not present

## 2016-12-14 DIAGNOSIS — I10 Essential (primary) hypertension: Secondary | ICD-10-CM | POA: Diagnosis not present

## 2016-12-14 DIAGNOSIS — K5900 Constipation, unspecified: Secondary | ICD-10-CM | POA: Diagnosis not present

## 2016-12-15 DIAGNOSIS — R296 Repeated falls: Secondary | ICD-10-CM | POA: Diagnosis not present

## 2016-12-15 DIAGNOSIS — H3530 Unspecified macular degeneration: Secondary | ICD-10-CM | POA: Diagnosis not present

## 2016-12-15 DIAGNOSIS — M810 Age-related osteoporosis without current pathological fracture: Secondary | ICD-10-CM | POA: Diagnosis not present

## 2016-12-15 DIAGNOSIS — Z86718 Personal history of other venous thrombosis and embolism: Secondary | ICD-10-CM | POA: Diagnosis not present

## 2016-12-15 DIAGNOSIS — N3941 Urge incontinence: Secondary | ICD-10-CM | POA: Diagnosis not present

## 2016-12-15 DIAGNOSIS — K5900 Constipation, unspecified: Secondary | ICD-10-CM | POA: Diagnosis not present

## 2016-12-15 DIAGNOSIS — E039 Hypothyroidism, unspecified: Secondary | ICD-10-CM | POA: Diagnosis not present

## 2016-12-15 DIAGNOSIS — H00014 Hordeolum externum left upper eyelid: Secondary | ICD-10-CM | POA: Diagnosis not present

## 2016-12-15 DIAGNOSIS — D492 Neoplasm of unspecified behavior of bone, soft tissue, and skin: Secondary | ICD-10-CM | POA: Diagnosis not present

## 2016-12-15 DIAGNOSIS — I10 Essential (primary) hypertension: Secondary | ICD-10-CM | POA: Diagnosis not present

## 2016-12-15 DIAGNOSIS — H00016 Hordeolum externum left eye, unspecified eyelid: Secondary | ICD-10-CM | POA: Diagnosis not present

## 2016-12-15 DIAGNOSIS — N183 Chronic kidney disease, stage 3 (moderate): Secondary | ICD-10-CM | POA: Diagnosis not present

## 2016-12-15 DIAGNOSIS — E785 Hyperlipidemia, unspecified: Secondary | ICD-10-CM | POA: Diagnosis not present

## 2016-12-17 DIAGNOSIS — H61002 Unspecified perichondritis of left external ear: Secondary | ICD-10-CM | POA: Diagnosis not present

## 2016-12-17 DIAGNOSIS — L853 Xerosis cutis: Secondary | ICD-10-CM | POA: Diagnosis not present

## 2016-12-17 DIAGNOSIS — H61022 Chronic perichondritis of left external ear: Secondary | ICD-10-CM | POA: Diagnosis not present

## 2016-12-21 DIAGNOSIS — C44229 Squamous cell carcinoma of skin of left ear and external auricular canal: Secondary | ICD-10-CM | POA: Diagnosis not present

## 2016-12-21 DIAGNOSIS — R296 Repeated falls: Secondary | ICD-10-CM | POA: Diagnosis not present

## 2016-12-21 DIAGNOSIS — I129 Hypertensive chronic kidney disease with stage 1 through stage 4 chronic kidney disease, or unspecified chronic kidney disease: Secondary | ICD-10-CM | POA: Diagnosis not present

## 2016-12-21 DIAGNOSIS — N183 Chronic kidney disease, stage 3 (moderate): Secondary | ICD-10-CM | POA: Diagnosis not present

## 2016-12-21 DIAGNOSIS — E039 Hypothyroidism, unspecified: Secondary | ICD-10-CM | POA: Diagnosis not present

## 2016-12-21 DIAGNOSIS — M810 Age-related osteoporosis without current pathological fracture: Secondary | ICD-10-CM | POA: Diagnosis not present

## 2016-12-24 DIAGNOSIS — R296 Repeated falls: Secondary | ICD-10-CM | POA: Diagnosis not present

## 2016-12-24 DIAGNOSIS — M810 Age-related osteoporosis without current pathological fracture: Secondary | ICD-10-CM | POA: Diagnosis not present

## 2016-12-24 DIAGNOSIS — I129 Hypertensive chronic kidney disease with stage 1 through stage 4 chronic kidney disease, or unspecified chronic kidney disease: Secondary | ICD-10-CM | POA: Diagnosis not present

## 2016-12-24 DIAGNOSIS — N183 Chronic kidney disease, stage 3 (moderate): Secondary | ICD-10-CM | POA: Diagnosis not present

## 2016-12-24 DIAGNOSIS — E039 Hypothyroidism, unspecified: Secondary | ICD-10-CM | POA: Diagnosis not present

## 2016-12-24 DIAGNOSIS — C44229 Squamous cell carcinoma of skin of left ear and external auricular canal: Secondary | ICD-10-CM | POA: Diagnosis not present

## 2016-12-28 DIAGNOSIS — M810 Age-related osteoporosis without current pathological fracture: Secondary | ICD-10-CM | POA: Diagnosis not present

## 2016-12-28 DIAGNOSIS — N183 Chronic kidney disease, stage 3 (moderate): Secondary | ICD-10-CM | POA: Diagnosis not present

## 2016-12-28 DIAGNOSIS — C44229 Squamous cell carcinoma of skin of left ear and external auricular canal: Secondary | ICD-10-CM | POA: Diagnosis not present

## 2016-12-28 DIAGNOSIS — I129 Hypertensive chronic kidney disease with stage 1 through stage 4 chronic kidney disease, or unspecified chronic kidney disease: Secondary | ICD-10-CM | POA: Diagnosis not present

## 2016-12-28 DIAGNOSIS — E039 Hypothyroidism, unspecified: Secondary | ICD-10-CM | POA: Diagnosis not present

## 2016-12-28 DIAGNOSIS — R296 Repeated falls: Secondary | ICD-10-CM | POA: Diagnosis not present

## 2016-12-31 DIAGNOSIS — I129 Hypertensive chronic kidney disease with stage 1 through stage 4 chronic kidney disease, or unspecified chronic kidney disease: Secondary | ICD-10-CM | POA: Diagnosis not present

## 2016-12-31 DIAGNOSIS — M810 Age-related osteoporosis without current pathological fracture: Secondary | ICD-10-CM | POA: Diagnosis not present

## 2016-12-31 DIAGNOSIS — E039 Hypothyroidism, unspecified: Secondary | ICD-10-CM | POA: Diagnosis not present

## 2016-12-31 DIAGNOSIS — C44229 Squamous cell carcinoma of skin of left ear and external auricular canal: Secondary | ICD-10-CM | POA: Diagnosis not present

## 2016-12-31 DIAGNOSIS — R296 Repeated falls: Secondary | ICD-10-CM | POA: Diagnosis not present

## 2016-12-31 DIAGNOSIS — N183 Chronic kidney disease, stage 3 (moderate): Secondary | ICD-10-CM | POA: Diagnosis not present

## 2017-01-04 DIAGNOSIS — C44229 Squamous cell carcinoma of skin of left ear and external auricular canal: Secondary | ICD-10-CM | POA: Diagnosis not present

## 2017-01-04 DIAGNOSIS — N183 Chronic kidney disease, stage 3 (moderate): Secondary | ICD-10-CM | POA: Diagnosis not present

## 2017-01-04 DIAGNOSIS — M810 Age-related osteoporosis without current pathological fracture: Secondary | ICD-10-CM | POA: Diagnosis not present

## 2017-01-04 DIAGNOSIS — R296 Repeated falls: Secondary | ICD-10-CM | POA: Diagnosis not present

## 2017-01-04 DIAGNOSIS — I129 Hypertensive chronic kidney disease with stage 1 through stage 4 chronic kidney disease, or unspecified chronic kidney disease: Secondary | ICD-10-CM | POA: Diagnosis not present

## 2017-01-04 DIAGNOSIS — E039 Hypothyroidism, unspecified: Secondary | ICD-10-CM | POA: Diagnosis not present

## 2017-01-07 DIAGNOSIS — R296 Repeated falls: Secondary | ICD-10-CM | POA: Diagnosis not present

## 2017-01-07 DIAGNOSIS — M810 Age-related osteoporosis without current pathological fracture: Secondary | ICD-10-CM | POA: Diagnosis not present

## 2017-01-07 DIAGNOSIS — N183 Chronic kidney disease, stage 3 (moderate): Secondary | ICD-10-CM | POA: Diagnosis not present

## 2017-01-07 DIAGNOSIS — I129 Hypertensive chronic kidney disease with stage 1 through stage 4 chronic kidney disease, or unspecified chronic kidney disease: Secondary | ICD-10-CM | POA: Diagnosis not present

## 2017-01-07 DIAGNOSIS — C44229 Squamous cell carcinoma of skin of left ear and external auricular canal: Secondary | ICD-10-CM | POA: Diagnosis not present

## 2017-01-07 DIAGNOSIS — E039 Hypothyroidism, unspecified: Secondary | ICD-10-CM | POA: Diagnosis not present

## 2017-01-14 DIAGNOSIS — E039 Hypothyroidism, unspecified: Secondary | ICD-10-CM | POA: Diagnosis not present

## 2017-01-14 DIAGNOSIS — I129 Hypertensive chronic kidney disease with stage 1 through stage 4 chronic kidney disease, or unspecified chronic kidney disease: Secondary | ICD-10-CM | POA: Diagnosis not present

## 2017-01-14 DIAGNOSIS — N183 Chronic kidney disease, stage 3 (moderate): Secondary | ICD-10-CM | POA: Diagnosis not present

## 2017-01-14 DIAGNOSIS — C44229 Squamous cell carcinoma of skin of left ear and external auricular canal: Secondary | ICD-10-CM | POA: Diagnosis not present

## 2017-01-14 DIAGNOSIS — R296 Repeated falls: Secondary | ICD-10-CM | POA: Diagnosis not present

## 2017-01-14 DIAGNOSIS — M810 Age-related osteoporosis without current pathological fracture: Secondary | ICD-10-CM | POA: Diagnosis not present

## 2017-01-18 DIAGNOSIS — E039 Hypothyroidism, unspecified: Secondary | ICD-10-CM | POA: Diagnosis not present

## 2017-01-18 DIAGNOSIS — I129 Hypertensive chronic kidney disease with stage 1 through stage 4 chronic kidney disease, or unspecified chronic kidney disease: Secondary | ICD-10-CM | POA: Diagnosis not present

## 2017-01-18 DIAGNOSIS — C44229 Squamous cell carcinoma of skin of left ear and external auricular canal: Secondary | ICD-10-CM | POA: Diagnosis not present

## 2017-01-18 DIAGNOSIS — N183 Chronic kidney disease, stage 3 (moderate): Secondary | ICD-10-CM | POA: Diagnosis not present

## 2017-01-18 DIAGNOSIS — M810 Age-related osteoporosis without current pathological fracture: Secondary | ICD-10-CM | POA: Diagnosis not present

## 2017-01-18 DIAGNOSIS — R296 Repeated falls: Secondary | ICD-10-CM | POA: Diagnosis not present

## 2017-01-20 DIAGNOSIS — I129 Hypertensive chronic kidney disease with stage 1 through stage 4 chronic kidney disease, or unspecified chronic kidney disease: Secondary | ICD-10-CM | POA: Diagnosis not present

## 2017-01-20 DIAGNOSIS — C44229 Squamous cell carcinoma of skin of left ear and external auricular canal: Secondary | ICD-10-CM | POA: Diagnosis not present

## 2017-01-20 DIAGNOSIS — R296 Repeated falls: Secondary | ICD-10-CM | POA: Diagnosis not present

## 2017-01-20 DIAGNOSIS — N183 Chronic kidney disease, stage 3 (moderate): Secondary | ICD-10-CM | POA: Diagnosis not present

## 2017-01-20 DIAGNOSIS — M810 Age-related osteoporosis without current pathological fracture: Secondary | ICD-10-CM | POA: Diagnosis not present

## 2017-01-20 DIAGNOSIS — E039 Hypothyroidism, unspecified: Secondary | ICD-10-CM | POA: Diagnosis not present

## 2017-01-21 DIAGNOSIS — N183 Chronic kidney disease, stage 3 (moderate): Secondary | ICD-10-CM | POA: Diagnosis not present

## 2017-01-21 DIAGNOSIS — R296 Repeated falls: Secondary | ICD-10-CM | POA: Diagnosis not present

## 2017-01-21 DIAGNOSIS — I129 Hypertensive chronic kidney disease with stage 1 through stage 4 chronic kidney disease, or unspecified chronic kidney disease: Secondary | ICD-10-CM | POA: Diagnosis not present

## 2017-01-21 DIAGNOSIS — C44229 Squamous cell carcinoma of skin of left ear and external auricular canal: Secondary | ICD-10-CM | POA: Diagnosis not present

## 2017-01-21 DIAGNOSIS — E039 Hypothyroidism, unspecified: Secondary | ICD-10-CM | POA: Diagnosis not present

## 2017-01-21 DIAGNOSIS — M810 Age-related osteoporosis without current pathological fracture: Secondary | ICD-10-CM | POA: Diagnosis not present

## 2017-01-25 DIAGNOSIS — C44229 Squamous cell carcinoma of skin of left ear and external auricular canal: Secondary | ICD-10-CM | POA: Diagnosis not present

## 2017-01-25 DIAGNOSIS — I129 Hypertensive chronic kidney disease with stage 1 through stage 4 chronic kidney disease, or unspecified chronic kidney disease: Secondary | ICD-10-CM | POA: Diagnosis not present

## 2017-01-25 DIAGNOSIS — N183 Chronic kidney disease, stage 3 (moderate): Secondary | ICD-10-CM | POA: Diagnosis not present

## 2017-01-25 DIAGNOSIS — M810 Age-related osteoporosis without current pathological fracture: Secondary | ICD-10-CM | POA: Diagnosis not present

## 2017-01-25 DIAGNOSIS — E039 Hypothyroidism, unspecified: Secondary | ICD-10-CM | POA: Diagnosis not present

## 2017-01-25 DIAGNOSIS — R296 Repeated falls: Secondary | ICD-10-CM | POA: Diagnosis not present

## 2017-02-08 DIAGNOSIS — I129 Hypertensive chronic kidney disease with stage 1 through stage 4 chronic kidney disease, or unspecified chronic kidney disease: Secondary | ICD-10-CM | POA: Diagnosis not present

## 2017-02-08 DIAGNOSIS — E039 Hypothyroidism, unspecified: Secondary | ICD-10-CM | POA: Diagnosis not present

## 2017-02-08 DIAGNOSIS — C44229 Squamous cell carcinoma of skin of left ear and external auricular canal: Secondary | ICD-10-CM | POA: Diagnosis not present

## 2017-02-08 DIAGNOSIS — N183 Chronic kidney disease, stage 3 (moderate): Secondary | ICD-10-CM | POA: Diagnosis not present

## 2017-02-08 DIAGNOSIS — M810 Age-related osteoporosis without current pathological fracture: Secondary | ICD-10-CM | POA: Diagnosis not present

## 2017-02-08 DIAGNOSIS — R296 Repeated falls: Secondary | ICD-10-CM | POA: Diagnosis not present

## 2017-02-09 DIAGNOSIS — R001 Bradycardia, unspecified: Secondary | ICD-10-CM | POA: Diagnosis not present

## 2017-02-09 DIAGNOSIS — E039 Hypothyroidism, unspecified: Secondary | ICD-10-CM | POA: Diagnosis not present

## 2017-02-09 DIAGNOSIS — R5383 Other fatigue: Secondary | ICD-10-CM | POA: Diagnosis not present

## 2017-02-10 DIAGNOSIS — R5383 Other fatigue: Secondary | ICD-10-CM | POA: Diagnosis not present

## 2017-02-10 DIAGNOSIS — R001 Bradycardia, unspecified: Secondary | ICD-10-CM | POA: Diagnosis not present

## 2017-02-10 DIAGNOSIS — E039 Hypothyroidism, unspecified: Secondary | ICD-10-CM | POA: Diagnosis not present

## 2017-02-11 DIAGNOSIS — H353131 Nonexudative age-related macular degeneration, bilateral, early dry stage: Secondary | ICD-10-CM | POA: Diagnosis not present

## 2017-02-11 DIAGNOSIS — H11821 Conjunctivochalasis, right eye: Secondary | ICD-10-CM | POA: Diagnosis not present

## 2017-02-11 DIAGNOSIS — H401131 Primary open-angle glaucoma, bilateral, mild stage: Secondary | ICD-10-CM | POA: Diagnosis not present

## 2017-02-17 ENCOUNTER — Ambulatory Visit: Payer: Medicare Other | Admitting: Cardiology

## 2017-02-25 DIAGNOSIS — R531 Weakness: Secondary | ICD-10-CM | POA: Diagnosis not present

## 2017-03-03 ENCOUNTER — Ambulatory Visit: Payer: Medicare Other | Admitting: Cardiology

## 2017-03-03 DIAGNOSIS — R531 Weakness: Secondary | ICD-10-CM | POA: Diagnosis not present

## 2017-03-10 ENCOUNTER — Ambulatory Visit: Payer: Medicare Other | Admitting: Cardiology

## 2017-03-19 DIAGNOSIS — R531 Weakness: Secondary | ICD-10-CM | POA: Diagnosis not present

## 2017-04-19 DIAGNOSIS — Z23 Encounter for immunization: Secondary | ICD-10-CM | POA: Diagnosis not present

## 2017-06-16 DIAGNOSIS — R531 Weakness: Secondary | ICD-10-CM | POA: Diagnosis not present

## 2017-08-09 IMAGING — DX DG CHEST 2V
2 series · 2 of 2 positions shown · non-contrast
Comparison: Chest x-ray of July 16, 2014

CLINICAL DATA: 2-3 days of bilateral lower extremity swelling
greatest on the left. No shortness of breath or chest pain. Patient
reports nonproductive cough. The patient experiences dizzy spells
which are increasing.

EXAM:
CHEST  2 VIEW

[chest lat]
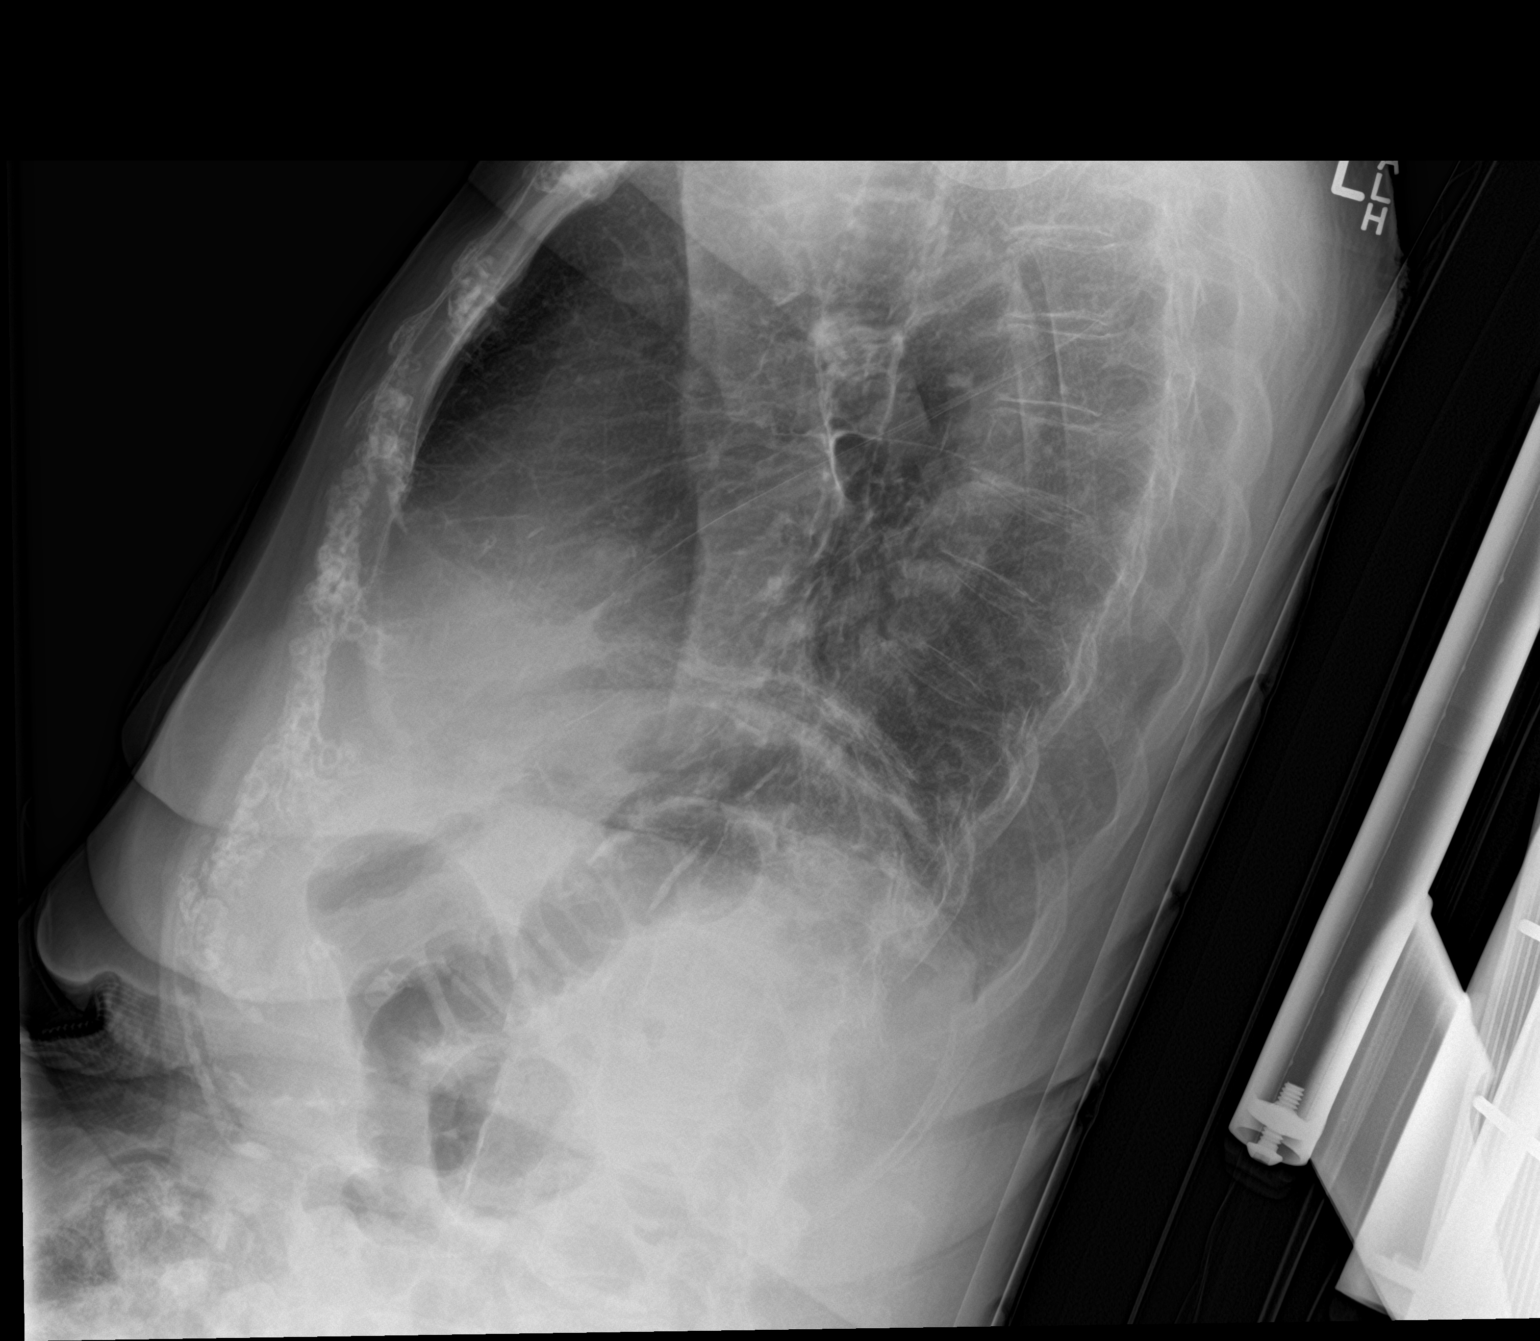

[chest ap]
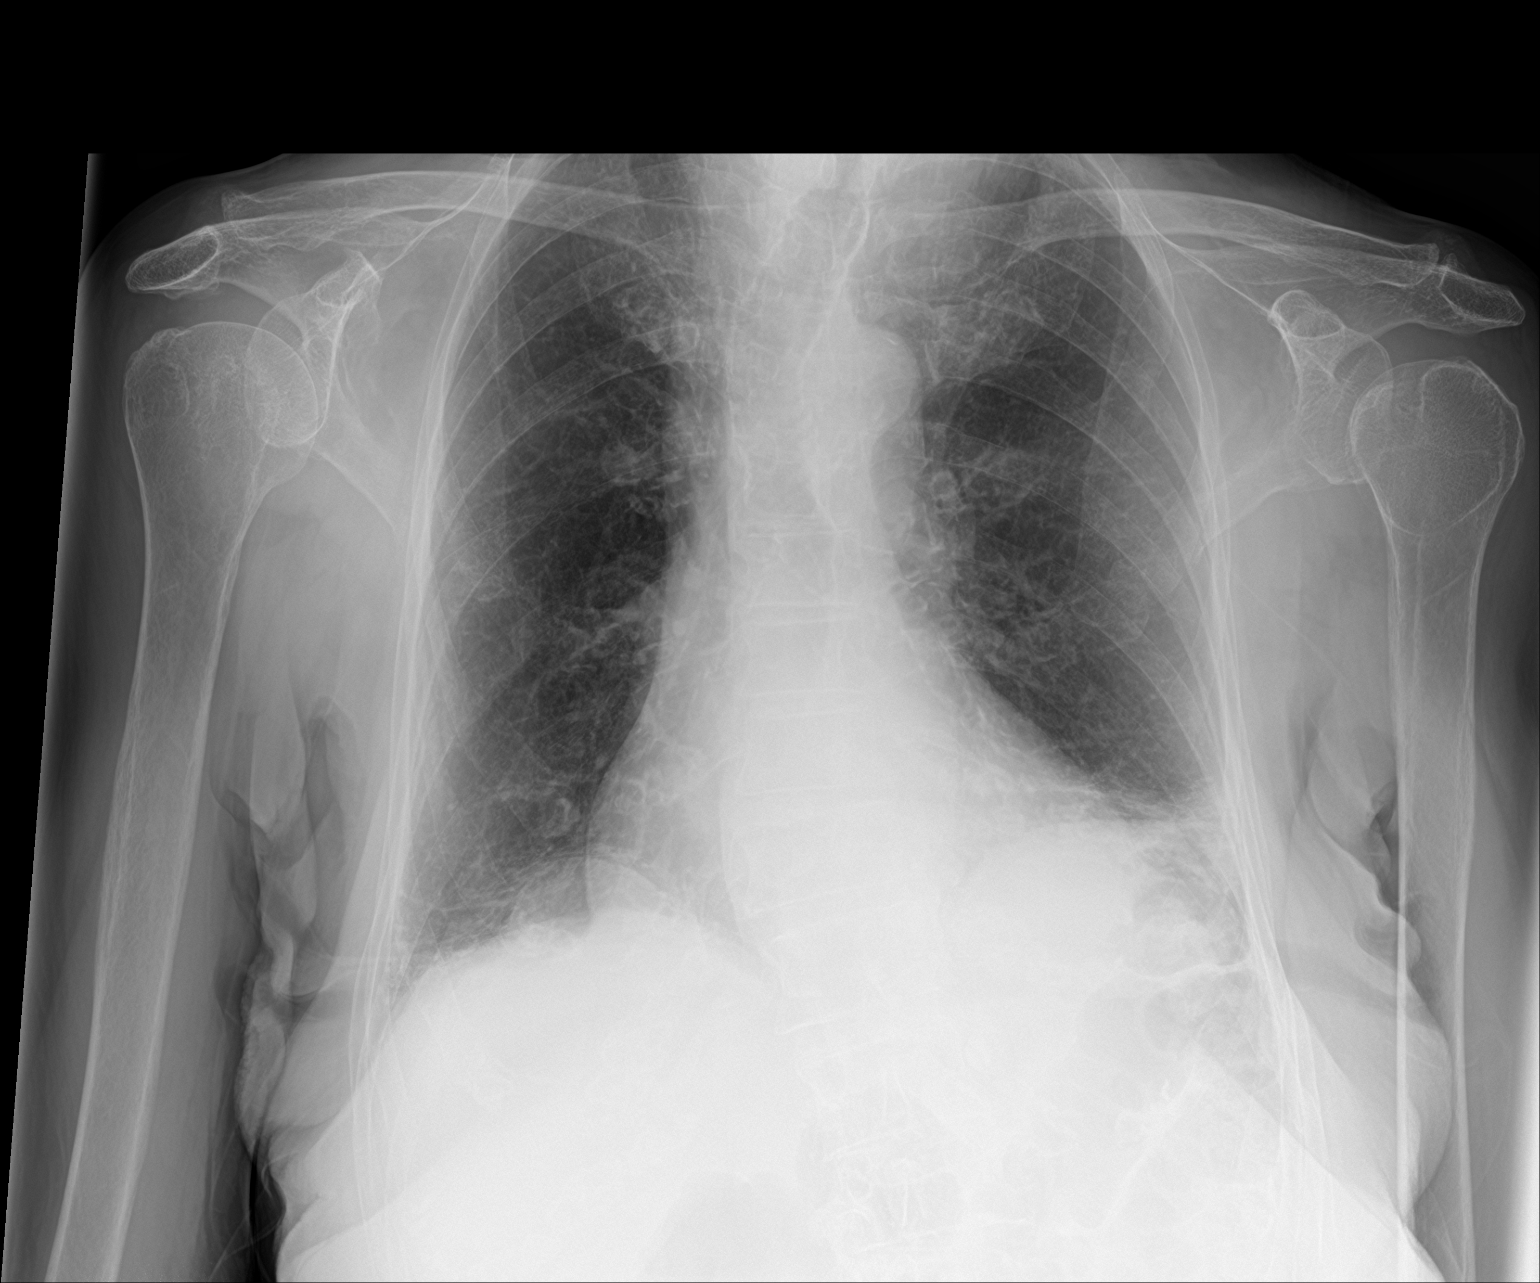

[2 of 2 positions shown; findings below may reference images not displayed]

FINDINGS: The lungs remain hyperinflated. There is persistent increased
density at the left lung base compatible with scarring. There is no
acute infiltrate. There is stable biapical pleural thickening. The
heart is mildly enlarged but stable. The pulmonary vascularity is
normal. There is calcification in the wall of the aortic arch. The
bony structures are subjectively osteopenic. No acute bony
abnormality is observed.
IMPRESSION: Mild chronic bronchitic changes with left basilar scarring. No acute
pneumonia nor pulmonary edema. Stable mild cardiomegaly.

Thoracic aortic atherosclerosis.

## 2017-09-08 DIAGNOSIS — R531 Weakness: Secondary | ICD-10-CM | POA: Diagnosis not present

## 2017-09-22 DIAGNOSIS — R531 Weakness: Secondary | ICD-10-CM | POA: Diagnosis not present

## 2017-10-04 DIAGNOSIS — I10 Essential (primary) hypertension: Secondary | ICD-10-CM | POA: Diagnosis not present

## 2017-10-04 DIAGNOSIS — H3530 Unspecified macular degeneration: Secondary | ICD-10-CM | POA: Diagnosis not present

## 2017-10-04 DIAGNOSIS — I495 Sick sinus syndrome: Secondary | ICD-10-CM | POA: Diagnosis not present

## 2017-10-04 DIAGNOSIS — R54 Age-related physical debility: Secondary | ICD-10-CM | POA: Diagnosis not present

## 2017-10-04 DIAGNOSIS — E039 Hypothyroidism, unspecified: Secondary | ICD-10-CM | POA: Diagnosis not present

## 2017-10-04 DIAGNOSIS — H11149 Conjunctival xerosis, unspecified, unspecified eye: Secondary | ICD-10-CM | POA: Diagnosis not present

## 2017-10-04 DIAGNOSIS — N183 Chronic kidney disease, stage 3 (moderate): Secondary | ICD-10-CM | POA: Diagnosis not present

## 2017-10-04 DIAGNOSIS — Z86718 Personal history of other venous thrombosis and embolism: Secondary | ICD-10-CM | POA: Diagnosis not present

## 2017-10-04 DIAGNOSIS — I519 Heart disease, unspecified: Secondary | ICD-10-CM | POA: Diagnosis not present

## 2017-10-04 DIAGNOSIS — F411 Generalized anxiety disorder: Secondary | ICD-10-CM | POA: Diagnosis not present

## 2017-10-05 DIAGNOSIS — I10 Essential (primary) hypertension: Secondary | ICD-10-CM | POA: Diagnosis not present

## 2017-10-05 DIAGNOSIS — I519 Heart disease, unspecified: Secondary | ICD-10-CM | POA: Diagnosis not present

## 2017-10-05 DIAGNOSIS — Z86718 Personal history of other venous thrombosis and embolism: Secondary | ICD-10-CM | POA: Diagnosis not present

## 2017-10-05 DIAGNOSIS — F411 Generalized anxiety disorder: Secondary | ICD-10-CM | POA: Diagnosis not present

## 2017-10-05 DIAGNOSIS — N183 Chronic kidney disease, stage 3 (moderate): Secondary | ICD-10-CM | POA: Diagnosis not present

## 2017-10-05 DIAGNOSIS — I495 Sick sinus syndrome: Secondary | ICD-10-CM | POA: Diagnosis not present

## 2017-10-07 DIAGNOSIS — F411 Generalized anxiety disorder: Secondary | ICD-10-CM | POA: Diagnosis not present

## 2017-10-07 DIAGNOSIS — I10 Essential (primary) hypertension: Secondary | ICD-10-CM | POA: Diagnosis not present

## 2017-10-07 DIAGNOSIS — I519 Heart disease, unspecified: Secondary | ICD-10-CM | POA: Diagnosis not present

## 2017-10-07 DIAGNOSIS — N183 Chronic kidney disease, stage 3 (moderate): Secondary | ICD-10-CM | POA: Diagnosis not present

## 2017-10-07 DIAGNOSIS — Z86718 Personal history of other venous thrombosis and embolism: Secondary | ICD-10-CM | POA: Diagnosis not present

## 2017-10-07 DIAGNOSIS — I495 Sick sinus syndrome: Secondary | ICD-10-CM | POA: Diagnosis not present

## 2017-10-08 DIAGNOSIS — E039 Hypothyroidism, unspecified: Secondary | ICD-10-CM | POA: Diagnosis not present

## 2017-10-08 DIAGNOSIS — R296 Repeated falls: Secondary | ICD-10-CM | POA: Diagnosis not present

## 2017-10-08 DIAGNOSIS — B372 Candidiasis of skin and nail: Secondary | ICD-10-CM | POA: Diagnosis not present

## 2017-10-08 DIAGNOSIS — H3530 Unspecified macular degeneration: Secondary | ICD-10-CM | POA: Diagnosis not present

## 2017-10-08 DIAGNOSIS — Z0000 Encounter for general adult medical examination without abnormal findings: Secondary | ICD-10-CM | POA: Diagnosis not present

## 2017-10-11 DIAGNOSIS — I519 Heart disease, unspecified: Secondary | ICD-10-CM | POA: Diagnosis not present

## 2017-10-11 DIAGNOSIS — I495 Sick sinus syndrome: Secondary | ICD-10-CM | POA: Diagnosis not present

## 2017-10-11 DIAGNOSIS — I10 Essential (primary) hypertension: Secondary | ICD-10-CM | POA: Diagnosis not present

## 2017-10-11 DIAGNOSIS — F411 Generalized anxiety disorder: Secondary | ICD-10-CM | POA: Diagnosis not present

## 2017-10-11 DIAGNOSIS — Z86718 Personal history of other venous thrombosis and embolism: Secondary | ICD-10-CM | POA: Diagnosis not present

## 2017-10-11 DIAGNOSIS — N183 Chronic kidney disease, stage 3 (moderate): Secondary | ICD-10-CM | POA: Diagnosis not present

## 2017-10-13 DIAGNOSIS — I495 Sick sinus syndrome: Secondary | ICD-10-CM | POA: Diagnosis not present

## 2017-10-13 DIAGNOSIS — F411 Generalized anxiety disorder: Secondary | ICD-10-CM | POA: Diagnosis not present

## 2017-10-13 DIAGNOSIS — N183 Chronic kidney disease, stage 3 (moderate): Secondary | ICD-10-CM | POA: Diagnosis not present

## 2017-10-13 DIAGNOSIS — I10 Essential (primary) hypertension: Secondary | ICD-10-CM | POA: Diagnosis not present

## 2017-10-13 DIAGNOSIS — Z86718 Personal history of other venous thrombosis and embolism: Secondary | ICD-10-CM | POA: Diagnosis not present

## 2017-10-13 DIAGNOSIS — I519 Heart disease, unspecified: Secondary | ICD-10-CM | POA: Diagnosis not present

## 2017-10-14 DIAGNOSIS — F411 Generalized anxiety disorder: Secondary | ICD-10-CM | POA: Diagnosis not present

## 2017-10-14 DIAGNOSIS — N183 Chronic kidney disease, stage 3 (moderate): Secondary | ICD-10-CM | POA: Diagnosis not present

## 2017-10-14 DIAGNOSIS — I495 Sick sinus syndrome: Secondary | ICD-10-CM | POA: Diagnosis not present

## 2017-10-14 DIAGNOSIS — I519 Heart disease, unspecified: Secondary | ICD-10-CM | POA: Diagnosis not present

## 2017-10-14 DIAGNOSIS — Z86718 Personal history of other venous thrombosis and embolism: Secondary | ICD-10-CM | POA: Diagnosis not present

## 2017-10-14 DIAGNOSIS — I10 Essential (primary) hypertension: Secondary | ICD-10-CM | POA: Diagnosis not present

## 2017-10-20 DIAGNOSIS — Z86718 Personal history of other venous thrombosis and embolism: Secondary | ICD-10-CM | POA: Diagnosis not present

## 2017-10-20 DIAGNOSIS — F411 Generalized anxiety disorder: Secondary | ICD-10-CM | POA: Diagnosis not present

## 2017-10-20 DIAGNOSIS — I10 Essential (primary) hypertension: Secondary | ICD-10-CM | POA: Diagnosis not present

## 2017-10-20 DIAGNOSIS — I495 Sick sinus syndrome: Secondary | ICD-10-CM | POA: Diagnosis not present

## 2017-10-20 DIAGNOSIS — N183 Chronic kidney disease, stage 3 (moderate): Secondary | ICD-10-CM | POA: Diagnosis not present

## 2017-10-20 DIAGNOSIS — I519 Heart disease, unspecified: Secondary | ICD-10-CM | POA: Diagnosis not present

## 2017-10-21 DIAGNOSIS — Z86718 Personal history of other venous thrombosis and embolism: Secondary | ICD-10-CM | POA: Diagnosis not present

## 2017-10-21 DIAGNOSIS — I10 Essential (primary) hypertension: Secondary | ICD-10-CM | POA: Diagnosis not present

## 2017-10-21 DIAGNOSIS — F411 Generalized anxiety disorder: Secondary | ICD-10-CM | POA: Diagnosis not present

## 2017-10-21 DIAGNOSIS — I495 Sick sinus syndrome: Secondary | ICD-10-CM | POA: Diagnosis not present

## 2017-10-21 DIAGNOSIS — I519 Heart disease, unspecified: Secondary | ICD-10-CM | POA: Diagnosis not present

## 2017-10-21 DIAGNOSIS — N183 Chronic kidney disease, stage 3 (moderate): Secondary | ICD-10-CM | POA: Diagnosis not present

## 2017-10-27 DIAGNOSIS — R54 Age-related physical debility: Secondary | ICD-10-CM | POA: Diagnosis not present

## 2017-10-27 DIAGNOSIS — N183 Chronic kidney disease, stage 3 (moderate): Secondary | ICD-10-CM | POA: Diagnosis not present

## 2017-10-27 DIAGNOSIS — H3530 Unspecified macular degeneration: Secondary | ICD-10-CM | POA: Diagnosis not present

## 2017-10-27 DIAGNOSIS — Z86718 Personal history of other venous thrombosis and embolism: Secondary | ICD-10-CM | POA: Diagnosis not present

## 2017-10-27 DIAGNOSIS — F411 Generalized anxiety disorder: Secondary | ICD-10-CM | POA: Diagnosis not present

## 2017-10-27 DIAGNOSIS — E039 Hypothyroidism, unspecified: Secondary | ICD-10-CM | POA: Diagnosis not present

## 2017-10-27 DIAGNOSIS — I495 Sick sinus syndrome: Secondary | ICD-10-CM | POA: Diagnosis not present

## 2017-10-27 DIAGNOSIS — I10 Essential (primary) hypertension: Secondary | ICD-10-CM | POA: Diagnosis not present

## 2017-10-27 DIAGNOSIS — I519 Heart disease, unspecified: Secondary | ICD-10-CM | POA: Diagnosis not present

## 2017-10-27 DIAGNOSIS — H11149 Conjunctival xerosis, unspecified, unspecified eye: Secondary | ICD-10-CM | POA: Diagnosis not present

## 2017-10-28 DIAGNOSIS — F411 Generalized anxiety disorder: Secondary | ICD-10-CM | POA: Diagnosis not present

## 2017-10-28 DIAGNOSIS — N183 Chronic kidney disease, stage 3 (moderate): Secondary | ICD-10-CM | POA: Diagnosis not present

## 2017-10-28 DIAGNOSIS — Z86718 Personal history of other venous thrombosis and embolism: Secondary | ICD-10-CM | POA: Diagnosis not present

## 2017-10-28 DIAGNOSIS — I495 Sick sinus syndrome: Secondary | ICD-10-CM | POA: Diagnosis not present

## 2017-10-28 DIAGNOSIS — I519 Heart disease, unspecified: Secondary | ICD-10-CM | POA: Diagnosis not present

## 2017-10-28 DIAGNOSIS — I10 Essential (primary) hypertension: Secondary | ICD-10-CM | POA: Diagnosis not present

## 2017-11-03 DIAGNOSIS — I519 Heart disease, unspecified: Secondary | ICD-10-CM | POA: Diagnosis not present

## 2017-11-03 DIAGNOSIS — N183 Chronic kidney disease, stage 3 (moderate): Secondary | ICD-10-CM | POA: Diagnosis not present

## 2017-11-03 DIAGNOSIS — F411 Generalized anxiety disorder: Secondary | ICD-10-CM | POA: Diagnosis not present

## 2017-11-03 DIAGNOSIS — I495 Sick sinus syndrome: Secondary | ICD-10-CM | POA: Diagnosis not present

## 2017-11-03 DIAGNOSIS — Z86718 Personal history of other venous thrombosis and embolism: Secondary | ICD-10-CM | POA: Diagnosis not present

## 2017-11-03 DIAGNOSIS — I10 Essential (primary) hypertension: Secondary | ICD-10-CM | POA: Diagnosis not present

## 2017-11-04 DIAGNOSIS — N183 Chronic kidney disease, stage 3 (moderate): Secondary | ICD-10-CM | POA: Diagnosis not present

## 2017-11-04 DIAGNOSIS — Z86718 Personal history of other venous thrombosis and embolism: Secondary | ICD-10-CM | POA: Diagnosis not present

## 2017-11-04 DIAGNOSIS — F411 Generalized anxiety disorder: Secondary | ICD-10-CM | POA: Diagnosis not present

## 2017-11-04 DIAGNOSIS — I519 Heart disease, unspecified: Secondary | ICD-10-CM | POA: Diagnosis not present

## 2017-11-04 DIAGNOSIS — I495 Sick sinus syndrome: Secondary | ICD-10-CM | POA: Diagnosis not present

## 2017-11-04 DIAGNOSIS — I10 Essential (primary) hypertension: Secondary | ICD-10-CM | POA: Diagnosis not present

## 2017-11-08 DIAGNOSIS — I519 Heart disease, unspecified: Secondary | ICD-10-CM | POA: Diagnosis not present

## 2017-11-08 DIAGNOSIS — Z86718 Personal history of other venous thrombosis and embolism: Secondary | ICD-10-CM | POA: Diagnosis not present

## 2017-11-08 DIAGNOSIS — I495 Sick sinus syndrome: Secondary | ICD-10-CM | POA: Diagnosis not present

## 2017-11-08 DIAGNOSIS — I10 Essential (primary) hypertension: Secondary | ICD-10-CM | POA: Diagnosis not present

## 2017-11-08 DIAGNOSIS — F411 Generalized anxiety disorder: Secondary | ICD-10-CM | POA: Diagnosis not present

## 2017-11-08 DIAGNOSIS — N183 Chronic kidney disease, stage 3 (moderate): Secondary | ICD-10-CM | POA: Diagnosis not present

## 2017-11-10 DIAGNOSIS — I519 Heart disease, unspecified: Secondary | ICD-10-CM | POA: Diagnosis not present

## 2017-11-10 DIAGNOSIS — I10 Essential (primary) hypertension: Secondary | ICD-10-CM | POA: Diagnosis not present

## 2017-11-10 DIAGNOSIS — N183 Chronic kidney disease, stage 3 (moderate): Secondary | ICD-10-CM | POA: Diagnosis not present

## 2017-11-10 DIAGNOSIS — Z86718 Personal history of other venous thrombosis and embolism: Secondary | ICD-10-CM | POA: Diagnosis not present

## 2017-11-10 DIAGNOSIS — I495 Sick sinus syndrome: Secondary | ICD-10-CM | POA: Diagnosis not present

## 2017-11-10 DIAGNOSIS — F411 Generalized anxiety disorder: Secondary | ICD-10-CM | POA: Diagnosis not present

## 2017-11-11 DIAGNOSIS — N183 Chronic kidney disease, stage 3 (moderate): Secondary | ICD-10-CM | POA: Diagnosis not present

## 2017-11-11 DIAGNOSIS — F411 Generalized anxiety disorder: Secondary | ICD-10-CM | POA: Diagnosis not present

## 2017-11-11 DIAGNOSIS — I10 Essential (primary) hypertension: Secondary | ICD-10-CM | POA: Diagnosis not present

## 2017-11-11 DIAGNOSIS — I495 Sick sinus syndrome: Secondary | ICD-10-CM | POA: Diagnosis not present

## 2017-11-11 DIAGNOSIS — I519 Heart disease, unspecified: Secondary | ICD-10-CM | POA: Diagnosis not present

## 2017-11-11 DIAGNOSIS — Z86718 Personal history of other venous thrombosis and embolism: Secondary | ICD-10-CM | POA: Diagnosis not present

## 2017-11-12 DIAGNOSIS — I495 Sick sinus syndrome: Secondary | ICD-10-CM | POA: Diagnosis not present

## 2017-11-12 DIAGNOSIS — I519 Heart disease, unspecified: Secondary | ICD-10-CM | POA: Diagnosis not present

## 2017-11-12 DIAGNOSIS — Z86718 Personal history of other venous thrombosis and embolism: Secondary | ICD-10-CM | POA: Diagnosis not present

## 2017-11-12 DIAGNOSIS — N183 Chronic kidney disease, stage 3 (moderate): Secondary | ICD-10-CM | POA: Diagnosis not present

## 2017-11-12 DIAGNOSIS — F411 Generalized anxiety disorder: Secondary | ICD-10-CM | POA: Diagnosis not present

## 2017-11-12 DIAGNOSIS — I10 Essential (primary) hypertension: Secondary | ICD-10-CM | POA: Diagnosis not present

## 2017-11-15 DIAGNOSIS — I495 Sick sinus syndrome: Secondary | ICD-10-CM | POA: Diagnosis not present

## 2017-11-15 DIAGNOSIS — N183 Chronic kidney disease, stage 3 (moderate): Secondary | ICD-10-CM | POA: Diagnosis not present

## 2017-11-15 DIAGNOSIS — F411 Generalized anxiety disorder: Secondary | ICD-10-CM | POA: Diagnosis not present

## 2017-11-15 DIAGNOSIS — I519 Heart disease, unspecified: Secondary | ICD-10-CM | POA: Diagnosis not present

## 2017-11-15 DIAGNOSIS — Z86718 Personal history of other venous thrombosis and embolism: Secondary | ICD-10-CM | POA: Diagnosis not present

## 2017-11-15 DIAGNOSIS — I10 Essential (primary) hypertension: Secondary | ICD-10-CM | POA: Diagnosis not present

## 2017-11-17 DIAGNOSIS — Z86718 Personal history of other venous thrombosis and embolism: Secondary | ICD-10-CM | POA: Diagnosis not present

## 2017-11-17 DIAGNOSIS — F411 Generalized anxiety disorder: Secondary | ICD-10-CM | POA: Diagnosis not present

## 2017-11-17 DIAGNOSIS — I519 Heart disease, unspecified: Secondary | ICD-10-CM | POA: Diagnosis not present

## 2017-11-17 DIAGNOSIS — N183 Chronic kidney disease, stage 3 (moderate): Secondary | ICD-10-CM | POA: Diagnosis not present

## 2017-11-17 DIAGNOSIS — I10 Essential (primary) hypertension: Secondary | ICD-10-CM | POA: Diagnosis not present

## 2017-11-17 DIAGNOSIS — I495 Sick sinus syndrome: Secondary | ICD-10-CM | POA: Diagnosis not present

## 2017-11-18 DIAGNOSIS — Z86718 Personal history of other venous thrombosis and embolism: Secondary | ICD-10-CM | POA: Diagnosis not present

## 2017-11-18 DIAGNOSIS — I10 Essential (primary) hypertension: Secondary | ICD-10-CM | POA: Diagnosis not present

## 2017-11-18 DIAGNOSIS — I519 Heart disease, unspecified: Secondary | ICD-10-CM | POA: Diagnosis not present

## 2017-11-18 DIAGNOSIS — I495 Sick sinus syndrome: Secondary | ICD-10-CM | POA: Diagnosis not present

## 2017-11-18 DIAGNOSIS — F411 Generalized anxiety disorder: Secondary | ICD-10-CM | POA: Diagnosis not present

## 2017-11-18 DIAGNOSIS — N183 Chronic kidney disease, stage 3 (moderate): Secondary | ICD-10-CM | POA: Diagnosis not present

## 2017-11-22 DIAGNOSIS — I519 Heart disease, unspecified: Secondary | ICD-10-CM | POA: Diagnosis not present

## 2017-11-22 DIAGNOSIS — I495 Sick sinus syndrome: Secondary | ICD-10-CM | POA: Diagnosis not present

## 2017-11-22 DIAGNOSIS — Z86718 Personal history of other venous thrombosis and embolism: Secondary | ICD-10-CM | POA: Diagnosis not present

## 2017-11-22 DIAGNOSIS — I10 Essential (primary) hypertension: Secondary | ICD-10-CM | POA: Diagnosis not present

## 2017-11-22 DIAGNOSIS — F411 Generalized anxiety disorder: Secondary | ICD-10-CM | POA: Diagnosis not present

## 2017-11-22 DIAGNOSIS — N183 Chronic kidney disease, stage 3 (moderate): Secondary | ICD-10-CM | POA: Diagnosis not present

## 2017-11-24 DIAGNOSIS — F411 Generalized anxiety disorder: Secondary | ICD-10-CM | POA: Diagnosis not present

## 2017-11-24 DIAGNOSIS — I10 Essential (primary) hypertension: Secondary | ICD-10-CM | POA: Diagnosis not present

## 2017-11-24 DIAGNOSIS — I519 Heart disease, unspecified: Secondary | ICD-10-CM | POA: Diagnosis not present

## 2017-11-24 DIAGNOSIS — B372 Candidiasis of skin and nail: Secondary | ICD-10-CM | POA: Diagnosis not present

## 2017-11-24 DIAGNOSIS — Z86718 Personal history of other venous thrombosis and embolism: Secondary | ICD-10-CM | POA: Diagnosis not present

## 2017-11-24 DIAGNOSIS — N183 Chronic kidney disease, stage 3 (moderate): Secondary | ICD-10-CM | POA: Diagnosis not present

## 2017-11-24 DIAGNOSIS — R296 Repeated falls: Secondary | ICD-10-CM | POA: Diagnosis not present

## 2017-11-24 DIAGNOSIS — F419 Anxiety disorder, unspecified: Secondary | ICD-10-CM | POA: Diagnosis not present

## 2017-11-24 DIAGNOSIS — I495 Sick sinus syndrome: Secondary | ICD-10-CM | POA: Diagnosis not present

## 2017-11-24 DIAGNOSIS — H3530 Unspecified macular degeneration: Secondary | ICD-10-CM | POA: Diagnosis not present

## 2017-11-25 DIAGNOSIS — I519 Heart disease, unspecified: Secondary | ICD-10-CM | POA: Diagnosis not present

## 2017-11-25 DIAGNOSIS — I10 Essential (primary) hypertension: Secondary | ICD-10-CM | POA: Diagnosis not present

## 2017-11-25 DIAGNOSIS — N183 Chronic kidney disease, stage 3 (moderate): Secondary | ICD-10-CM | POA: Diagnosis not present

## 2017-11-25 DIAGNOSIS — Z86718 Personal history of other venous thrombosis and embolism: Secondary | ICD-10-CM | POA: Diagnosis not present

## 2017-11-25 DIAGNOSIS — F411 Generalized anxiety disorder: Secondary | ICD-10-CM | POA: Diagnosis not present

## 2017-11-25 DIAGNOSIS — I495 Sick sinus syndrome: Secondary | ICD-10-CM | POA: Diagnosis not present

## 2017-11-26 DIAGNOSIS — N183 Chronic kidney disease, stage 3 (moderate): Secondary | ICD-10-CM | POA: Diagnosis not present

## 2017-11-26 DIAGNOSIS — I519 Heart disease, unspecified: Secondary | ICD-10-CM | POA: Diagnosis not present

## 2017-11-26 DIAGNOSIS — Z86718 Personal history of other venous thrombosis and embolism: Secondary | ICD-10-CM | POA: Diagnosis not present

## 2017-11-26 DIAGNOSIS — I495 Sick sinus syndrome: Secondary | ICD-10-CM | POA: Diagnosis not present

## 2017-11-26 DIAGNOSIS — I10 Essential (primary) hypertension: Secondary | ICD-10-CM | POA: Diagnosis not present

## 2017-11-26 DIAGNOSIS — F411 Generalized anxiety disorder: Secondary | ICD-10-CM | POA: Diagnosis not present

## 2017-11-27 DIAGNOSIS — E039 Hypothyroidism, unspecified: Secondary | ICD-10-CM | POA: Diagnosis not present

## 2017-11-27 DIAGNOSIS — I10 Essential (primary) hypertension: Secondary | ICD-10-CM | POA: Diagnosis not present

## 2017-11-27 DIAGNOSIS — I519 Heart disease, unspecified: Secondary | ICD-10-CM | POA: Diagnosis not present

## 2017-11-27 DIAGNOSIS — I495 Sick sinus syndrome: Secondary | ICD-10-CM | POA: Diagnosis not present

## 2017-11-27 DIAGNOSIS — R54 Age-related physical debility: Secondary | ICD-10-CM | POA: Diagnosis not present

## 2017-11-27 DIAGNOSIS — H3530 Unspecified macular degeneration: Secondary | ICD-10-CM | POA: Diagnosis not present

## 2017-11-27 DIAGNOSIS — H11149 Conjunctival xerosis, unspecified, unspecified eye: Secondary | ICD-10-CM | POA: Diagnosis not present

## 2017-11-27 DIAGNOSIS — F411 Generalized anxiety disorder: Secondary | ICD-10-CM | POA: Diagnosis not present

## 2017-11-27 DIAGNOSIS — N183 Chronic kidney disease, stage 3 (moderate): Secondary | ICD-10-CM | POA: Diagnosis not present

## 2017-11-27 DIAGNOSIS — Z86718 Personal history of other venous thrombosis and embolism: Secondary | ICD-10-CM | POA: Diagnosis not present

## 2017-11-28 DIAGNOSIS — N183 Chronic kidney disease, stage 3 (moderate): Secondary | ICD-10-CM | POA: Diagnosis not present

## 2017-11-28 DIAGNOSIS — I10 Essential (primary) hypertension: Secondary | ICD-10-CM | POA: Diagnosis not present

## 2017-11-28 DIAGNOSIS — I495 Sick sinus syndrome: Secondary | ICD-10-CM | POA: Diagnosis not present

## 2017-11-28 DIAGNOSIS — I519 Heart disease, unspecified: Secondary | ICD-10-CM | POA: Diagnosis not present

## 2017-11-28 DIAGNOSIS — Z86718 Personal history of other venous thrombosis and embolism: Secondary | ICD-10-CM | POA: Diagnosis not present

## 2017-11-28 DIAGNOSIS — F411 Generalized anxiety disorder: Secondary | ICD-10-CM | POA: Diagnosis not present

## 2017-12-27 DEATH — deceased
